# Patient Record
Sex: Female | Born: 1965 | Race: Black or African American | Hispanic: No | Marital: Single | Smoking: Never smoker
Health system: Southern US, Community
[De-identification: ages and names within clinical notes are randomized; demographics above are authoritative.]

## PROBLEM LIST (undated history)

## (undated) DIAGNOSIS — E876 Hypokalemia: Secondary | ICD-10-CM

## (undated) DIAGNOSIS — N83209 Unspecified ovarian cyst, unspecified side: Secondary | ICD-10-CM

## (undated) DIAGNOSIS — I10 Essential (primary) hypertension: Secondary | ICD-10-CM

## (undated) DIAGNOSIS — F32A Depression, unspecified: Secondary | ICD-10-CM

## (undated) DIAGNOSIS — G4730 Sleep apnea, unspecified: Secondary | ICD-10-CM

## (undated) DIAGNOSIS — E7800 Pure hypercholesterolemia, unspecified: Secondary | ICD-10-CM

## (undated) DIAGNOSIS — F329 Major depressive disorder, single episode, unspecified: Secondary | ICD-10-CM

## (undated) DIAGNOSIS — K219 Gastro-esophageal reflux disease without esophagitis: Secondary | ICD-10-CM

## (undated) DIAGNOSIS — E119 Type 2 diabetes mellitus without complications: Secondary | ICD-10-CM

## (undated) HISTORY — DX: Essential (primary) hypertension: I10

## (undated) HISTORY — DX: Pure hypercholesterolemia, unspecified: E78.00

## (undated) HISTORY — DX: Hypokalemia: E87.6

## (undated) HISTORY — DX: Depression, unspecified: F32.A

## (undated) HISTORY — DX: Major depressive disorder, single episode, unspecified: F32.9

## (undated) HISTORY — DX: Gastro-esophageal reflux disease without esophagitis: K21.9

---

## 1997-05-06 ENCOUNTER — Emergency Department (HOSPITAL_COMMUNITY): Admission: EM | Admit: 1997-05-06 | Discharge: 1997-05-06 | Payer: Self-pay | Admitting: Emergency Medicine

## 1998-01-26 ENCOUNTER — Encounter: Admission: RE | Admit: 1998-01-26 | Discharge: 1998-01-26 | Payer: Self-pay | Admitting: Obstetrics

## 1998-03-10 ENCOUNTER — Other Ambulatory Visit: Admission: RE | Admit: 1998-03-10 | Discharge: 1998-03-10 | Payer: Self-pay | Admitting: Obstetrics and Gynecology

## 1999-10-02 ENCOUNTER — Encounter: Admission: RE | Admit: 1999-10-02 | Discharge: 1999-10-02 | Payer: Self-pay | Admitting: Internal Medicine

## 2000-04-22 ENCOUNTER — Encounter: Admission: RE | Admit: 2000-04-22 | Discharge: 2000-04-22 | Payer: Self-pay | Admitting: Internal Medicine

## 2000-12-30 ENCOUNTER — Encounter: Admission: RE | Admit: 2000-12-30 | Discharge: 2000-12-30 | Payer: Self-pay | Admitting: Internal Medicine

## 2001-04-09 ENCOUNTER — Ambulatory Visit (HOSPITAL_BASED_OUTPATIENT_CLINIC_OR_DEPARTMENT_OTHER): Admission: RE | Admit: 2001-04-09 | Discharge: 2001-04-09 | Payer: Self-pay | Admitting: Obstetrics and Gynecology

## 2001-04-14 ENCOUNTER — Emergency Department (HOSPITAL_COMMUNITY): Admission: EM | Admit: 2001-04-14 | Discharge: 2001-04-14 | Payer: Self-pay | Admitting: Emergency Medicine

## 2001-09-12 ENCOUNTER — Emergency Department (HOSPITAL_COMMUNITY): Admission: EM | Admit: 2001-09-12 | Discharge: 2001-09-12 | Payer: Self-pay | Admitting: Emergency Medicine

## 2002-03-17 ENCOUNTER — Emergency Department (HOSPITAL_COMMUNITY): Admission: EM | Admit: 2002-03-17 | Discharge: 2002-03-17 | Payer: Self-pay | Admitting: Emergency Medicine

## 2002-03-17 ENCOUNTER — Encounter: Payer: Self-pay | Admitting: Emergency Medicine

## 2003-01-20 ENCOUNTER — Emergency Department (HOSPITAL_COMMUNITY): Admission: EM | Admit: 2003-01-20 | Discharge: 2003-01-21 | Payer: Self-pay | Admitting: Emergency Medicine

## 2003-09-11 ENCOUNTER — Emergency Department (HOSPITAL_COMMUNITY): Admission: EM | Admit: 2003-09-11 | Discharge: 2003-09-11 | Payer: Self-pay | Admitting: *Deleted

## 2004-06-22 ENCOUNTER — Encounter (HOSPITAL_COMMUNITY): Admission: RE | Admit: 2004-06-22 | Discharge: 2004-09-20 | Payer: Self-pay | Admitting: Family Medicine

## 2004-06-23 ENCOUNTER — Emergency Department (HOSPITAL_COMMUNITY): Admission: EM | Admit: 2004-06-23 | Discharge: 2004-06-24 | Payer: Self-pay | Admitting: Emergency Medicine

## 2004-06-26 ENCOUNTER — Other Ambulatory Visit: Admission: RE | Admit: 2004-06-26 | Discharge: 2004-06-26 | Payer: Self-pay | Admitting: Obstetrics and Gynecology

## 2004-09-30 ENCOUNTER — Emergency Department (HOSPITAL_COMMUNITY): Admission: EM | Admit: 2004-09-30 | Discharge: 2004-10-01 | Payer: Self-pay | Admitting: Emergency Medicine

## 2004-11-30 ENCOUNTER — Encounter (INDEPENDENT_AMBULATORY_CARE_PROVIDER_SITE_OTHER): Payer: Self-pay | Admitting: Specialist

## 2004-11-30 ENCOUNTER — Observation Stay (HOSPITAL_COMMUNITY): Admission: RE | Admit: 2004-11-30 | Discharge: 2004-12-01 | Payer: Self-pay | Admitting: Obstetrics and Gynecology

## 2004-12-03 ENCOUNTER — Emergency Department (HOSPITAL_COMMUNITY): Admission: EM | Admit: 2004-12-03 | Discharge: 2004-12-04 | Payer: Self-pay | Admitting: Emergency Medicine

## 2004-12-17 ENCOUNTER — Ambulatory Visit (HOSPITAL_COMMUNITY): Admission: RE | Admit: 2004-12-17 | Discharge: 2004-12-17 | Payer: Self-pay | Admitting: Obstetrics and Gynecology

## 2005-03-24 ENCOUNTER — Emergency Department (HOSPITAL_COMMUNITY): Admission: EM | Admit: 2005-03-24 | Discharge: 2005-03-24 | Payer: Self-pay | Admitting: Emergency Medicine

## 2005-06-09 ENCOUNTER — Emergency Department (HOSPITAL_COMMUNITY): Admission: EM | Admit: 2005-06-09 | Discharge: 2005-06-09 | Payer: Self-pay | Admitting: *Deleted

## 2007-01-22 HISTORY — PX: PARTIAL HYSTERECTOMY: SHX80

## 2007-01-23 ENCOUNTER — Emergency Department (HOSPITAL_COMMUNITY): Admission: EM | Admit: 2007-01-23 | Discharge: 2007-01-23 | Payer: Self-pay | Admitting: Emergency Medicine

## 2007-11-06 ENCOUNTER — Encounter: Admission: RE | Admit: 2007-11-06 | Discharge: 2007-11-06 | Payer: Self-pay | Admitting: Obstetrics and Gynecology

## 2007-12-02 ENCOUNTER — Ambulatory Visit (HOSPITAL_COMMUNITY): Admission: RE | Admit: 2007-12-02 | Discharge: 2007-12-02 | Payer: Self-pay | Admitting: Obstetrics and Gynecology

## 2007-12-10 ENCOUNTER — Inpatient Hospital Stay (HOSPITAL_COMMUNITY): Admission: RE | Admit: 2007-12-10 | Discharge: 2007-12-12 | Payer: Self-pay | Admitting: Obstetrics and Gynecology

## 2007-12-10 ENCOUNTER — Encounter (INDEPENDENT_AMBULATORY_CARE_PROVIDER_SITE_OTHER): Payer: Self-pay | Admitting: Obstetrics and Gynecology

## 2007-12-13 ENCOUNTER — Inpatient Hospital Stay (HOSPITAL_COMMUNITY): Admission: AD | Admit: 2007-12-13 | Discharge: 2007-12-13 | Payer: Self-pay | Admitting: Obstetrics and Gynecology

## 2008-07-12 ENCOUNTER — Emergency Department (HOSPITAL_COMMUNITY): Admission: EM | Admit: 2008-07-12 | Discharge: 2008-07-13 | Payer: Self-pay | Admitting: Emergency Medicine

## 2008-10-03 ENCOUNTER — Encounter: Admission: RE | Admit: 2008-10-03 | Discharge: 2008-10-03 | Payer: Self-pay | Admitting: Family Medicine

## 2008-10-07 ENCOUNTER — Encounter: Admission: RE | Admit: 2008-10-07 | Discharge: 2008-10-07 | Payer: Self-pay | Admitting: Gastroenterology

## 2008-10-25 ENCOUNTER — Encounter: Admission: RE | Admit: 2008-10-25 | Discharge: 2008-10-25 | Payer: Self-pay | Admitting: Family Medicine

## 2008-11-25 ENCOUNTER — Encounter: Admission: RE | Admit: 2008-11-25 | Discharge: 2009-01-09 | Payer: Self-pay | Admitting: Family Medicine

## 2009-02-27 ENCOUNTER — Emergency Department (HOSPITAL_COMMUNITY): Admission: EM | Admit: 2009-02-27 | Discharge: 2009-02-27 | Payer: Self-pay | Admitting: Emergency Medicine

## 2009-03-30 ENCOUNTER — Emergency Department (HOSPITAL_COMMUNITY): Admission: EM | Admit: 2009-03-30 | Discharge: 2009-03-30 | Payer: Self-pay | Admitting: Emergency Medicine

## 2009-06-06 ENCOUNTER — Emergency Department (HOSPITAL_COMMUNITY): Admission: EM | Admit: 2009-06-06 | Discharge: 2009-06-06 | Payer: Self-pay | Admitting: Emergency Medicine

## 2010-02-12 ENCOUNTER — Encounter: Payer: Self-pay | Admitting: Family Medicine

## 2010-04-09 LAB — URINALYSIS, ROUTINE W REFLEX MICROSCOPIC
Bilirubin Urine: NEGATIVE
Bilirubin Urine: NEGATIVE
Glucose, UA: NEGATIVE mg/dL
Glucose, UA: NEGATIVE mg/dL
Hgb urine dipstick: NEGATIVE
Hgb urine dipstick: NEGATIVE
Ketones, ur: NEGATIVE mg/dL
Ketones, ur: NEGATIVE mg/dL
Nitrite: NEGATIVE
Nitrite: NEGATIVE
Protein, ur: NEGATIVE mg/dL
Protein, ur: NEGATIVE mg/dL
Specific Gravity, Urine: 1.021 (ref 1.005–1.030)
Specific Gravity, Urine: 1.022 (ref 1.005–1.030)
Urobilinogen, UA: 1 mg/dL (ref 0.0–1.0)
Urobilinogen, UA: 1 mg/dL (ref 0.0–1.0)
pH: 6 (ref 5.0–8.0)
pH: 6.5 (ref 5.0–8.0)

## 2010-04-09 LAB — CBC
HCT: 38.9 % (ref 36.0–46.0)
Hemoglobin: 13.5 g/dL (ref 12.0–15.0)
MCHC: 34.8 g/dL (ref 30.0–36.0)
MCV: 87.4 fL (ref 78.0–100.0)
Platelets: 220 10*3/uL (ref 150–400)
RBC: 4.45 MIL/uL (ref 3.87–5.11)
RDW: 13.4 % (ref 11.5–15.5)
WBC: 8.2 10*3/uL (ref 4.0–10.5)

## 2010-04-09 LAB — URINE CULTURE
Colony Count: NO GROWTH
Culture: NO GROWTH

## 2010-04-09 LAB — POCT I-STAT, CHEM 8
BUN: 11 mg/dL (ref 6–23)
Calcium, Ion: 1.16 mmol/L (ref 1.12–1.32)
Chloride: 104 mEq/L (ref 96–112)
Creatinine, Ser: 0.6 mg/dL (ref 0.4–1.2)
Glucose, Bld: 115 mg/dL — ABNORMAL HIGH (ref 70–99)
HCT: 42 % (ref 36.0–46.0)
Hemoglobin: 14.3 g/dL (ref 12.0–15.0)
Potassium: 3.3 mEq/L — ABNORMAL LOW (ref 3.5–5.1)
Sodium: 142 mEq/L (ref 135–145)
TCO2: 29 mmol/L (ref 0–100)

## 2010-04-09 LAB — CK: Total CK: 107 U/L (ref 7–177)

## 2010-04-11 LAB — URINALYSIS, ROUTINE W REFLEX MICROSCOPIC
Bilirubin Urine: NEGATIVE
Glucose, UA: NEGATIVE mg/dL
Hgb urine dipstick: NEGATIVE
Ketones, ur: NEGATIVE mg/dL
Nitrite: NEGATIVE
Protein, ur: NEGATIVE mg/dL
Specific Gravity, Urine: 1.025 (ref 1.005–1.030)
Urobilinogen, UA: 1 mg/dL (ref 0.0–1.0)
pH: 6.5 (ref 5.0–8.0)

## 2010-04-30 LAB — URINALYSIS, ROUTINE W REFLEX MICROSCOPIC
Bilirubin Urine: NEGATIVE
Glucose, UA: NEGATIVE mg/dL
Hgb urine dipstick: NEGATIVE
Ketones, ur: NEGATIVE mg/dL
Nitrite: NEGATIVE
Protein, ur: NEGATIVE mg/dL
Specific Gravity, Urine: 1.022 (ref 1.005–1.030)
Urobilinogen, UA: 0.2 mg/dL (ref 0.0–1.0)
pH: 5.5 (ref 5.0–8.0)

## 2010-06-05 NOTE — Op Note (Signed)
[name], [name]             ACCOUNT NO.:  [id number]   MEDICAL RECORD NO.:  [id number]          PATIENT TYPE:  INP   LOCATION:  9305                          FACILITY:  [facility]   PHYSICIAN:  Osborn Coho, M.D.   DATE OF BIRTH:  July 10, 1965   DATE OF PROCEDURE:  12/10/2007  DATE OF DISCHARGE:  12/12/2007                               OPERATIVE REPORT   PREOPERATIVE DIAGNOSES:  1. Complex left ovarian cyst.  2. Pelvic pain.  3. Status post hysterectomy.   POSTOPERATIVE DIAGNOSES:  1. Complex left ovarian cyst.  2. Pelvic pain.  3. Status post hysterectomy.  4. Dense pelvic adhesions.   PROCEDURES:  1. Laparoscopic lysis of adhesions.  2. Laparotomy.  3. Partial left salpingo-oophorectomy with lysis of adhesions.  4. Cystoscopy.   ATTENDING DOCTOR:  Osborn Coho, MD.   ASSISTANT:  Marquis Lunch. Lowell Guitar, PA-C.   ANESTHESIA:  General.   SPECIMENS TO PATHOLOGY:  Partial left ovarian cyst with portion of ovary  and tube.   FLUIDS:  2300 mL.   URINE OUTPUT:  200 mL.   ESTIMATED BLOOD LOSS:  100 mL.   COMPLICATIONS:  None.   DESCRIPTION OF PROCEDURE:  The patient was taken to the operating room  after risks, benefits, and alternatives were discussed with the patient.  The patient verbalized understanding and consent signed and witnessed.  The patient was placed under general anesthesia and prepped and draped  in the normal sterile fashion in a dorsal lithotomy position.  A ring  forceps with a sponge on it was placed in the patient's vagina and after  gowning and re-gloving, attention was then turned to the abdomen where a  10-mm incision was made at the umbilicus and open laparoscopy performed  by carrying down that incision to the fascia and peritoneum, which was  entered sharply with the Metzenbaum scissors.  The fascia was then  stitched using a pursestring stitch of 0 Vicryl and the Hasson was  placed into the umbilicus and the laparoscope introduced.  Pneumoperitoneum was achieved and there were some adhesions just above  the liver and there were some adhesions about to the sidewalls.  The  patient was placed in Trendelenburg and the intra-adhesions of the bowel  to the right and left ovaries were noted.  Attention was then turned to  the suprapubic region where a 10-mm incision was made and 10-mm trocar  advanced into the intra-abdominal cavity without difficulty under direct  visualization.  In the left lower quadrant, a 5-mm incision was made and  a 5-mm trocar advanced into the intra-abdominal cavity under direct  visualization.  Lysis of adhesions was performed removing the bowel from  the left sidewall and portion of the ovary as well as from the anterior  wall and bladder.  Getting down further with those adhesions of the  bowel to the left ovary, they were noted to be more and more dense and  decision was made to convert to laparotomy.  A Pfannenstiel skin  incision was made at the same level at the 10-mm incision.  This  incision was carried down to the  underlying layer of fascia.  The fascia  was excised bilaterally and extended bilaterally with the Mayo scissors.  Kocher clamps were placed on the superior aspect of the fascial incision  and the rectus muscle was dissected from the fascia.  The same was done  on the inferior aspect of the fascial incision.  The muscle was  separated in the midline and the peritoneum entered at the site of prior  entry with the trocar, which was then extended with the Metzenbaum  scissors and manually.  The pursestring stitch at the umbilicus was  tied.  The O'Connor-O'Sullivan self-retaining retractor was placed and 2  laparotomy sponges were placed to help push away the bowel.  There was  some bleeding noted from accessory vessels of the infundibulopelvic on  the left, which were clipped x3 with hemostatic clips.  Good hemostasis  was noted at that area.  Adhesions were taken down some more  that side  bluntly with the Metzenbaum scissors and secondary to the dense nature  of the adhesions further down the pelvis of the ovary to the sigmoid and  rectum, the decision was made to clamp as much of the ovary and cyst as  possible.  The cyst had already been drained inadvertently and copious  irrigation performed.  The cyst fluid appeared clear serous in nature.  The portion of the left ovary, cyst, and fallopian tube were then  excised and sent to Pathology and the remaining pedicle ligated with 0  Vicryl and suture ligated with 0 Vicryl as well.  There was good  hemostasis.  Copious irrigation was performed.  The small amount of  oozing where some adhesions had been removed near the bowel and the  remaining portion of the ovary and Surgicel was applied to this area.  Good hemostasis was noted.  The laparotomy sponges as well as the  O'Connor-O'Sullivan were removed and the peritoneum was repaired with 2-  0 chromic in a running fashion.  The fascia was repaired with 0 Vicryl  in a running fashion.  The subcutaneous tissue was irrigated and made  hemostatic with the Bovie and reapproximated using 3 interrupted  stitches of 2-0 plain.  The skin was reapproximated using 3-0 Monocryl  via a subcuticular stitch.  The skin was dressed with Benzoin and  quarter-inch Steri-Strips.  The left lower quadrant incision was  repaired with Dermabond.  The umbilical incision was repaired with 3-0  Monocryl via subcuticular stitch.  The sponge stick was removed from the  vagina.  Cystoscopy was performed and bilateral efflux from the ureters  were noted and no inadvertent bladder injuries were noted as well.  Cystoscopy was performed after removing the Foley and prepping the  urethra with Betadine and afterward the Foley was replaced to gravity.  Sponge, lap, and needle count was correct.  The patient tolerated the  procedure well and was returned to recovery room in good condition.       Osborn Coho, M.D.  Electronically Signed     AR/MEDQ  D:  12/12/2007  T:  12/12/2007  Job:  [id number]

## 2010-06-05 NOTE — H&P (Signed)
Barbara, [name]             ACCOUNT NO.:  [id number]   MEDICAL RECORD NO.:  [id number]          PATIENT TYPE:  INP   LOCATION:                                [facility]ACILITY:  [facility]   PHYSICIAN:  Osborn Coho, M.D.   DATE OF BIRTH:  1965-04-09   DATE OF ADMISSION:  12/02/2007  DATE OF DISCHARGE:  12/12/2007                              HISTORY & PHYSICAL   [name] is a 45 year old African American female para 2-0-1-2 who  presents for laparoscopic ovarian cystectomy with a possibility of a  bilateral salpingo-oophorectomy because of chronic pelvic pain and a  complex left ovarian cyst.  For the past year, the patient reports  intermittent pelvic pain, which occurs several times per month lasting  approximately an hour each time.  The patient rates this pain as a 7/10  on a 10-point pain scale.  However, states she does find some relief  (down to 4/10 on a 10-point pain scale) with Tylenol.  The patient is  unable to report any alleviating or exacerbating factors, just reports  that it occurs randomly.  She denies any urinary tract symptoms, nausea,  vomiting, diarrhea, fever, vaginitis symptoms, or changes in her bowel  habits.  A pelvic ultrasound in October 2009 revealed a surgically  absent uterus and cervix with a right ovary measuring 2.77 x 1.59 x 2.17  cm, a left ovary measuring 6.5 x 4.5 x 5.21 cm, and a left ovarian  complex cyst with multiple thin septations at a low level echogenicity  equivalent to 6.0 x 4.3 x 4.5 cm.  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  There was no free fluid seen.  A subsequent abdominal  and pelvic CT scan showed a normal urinary bladder with postoperative  changes consistent with a partial hysterectomy.  The left adnexa  revealed an intermediate density structure measuring 4.79 x 6.2 cm.  There was no ascites.  The bowel loops appeared normal.  There was no  abnormality of the bowel dilatation, wall thickening, or inflammatory  changes.  There were no enlarged lymph nodes identified.  It was  concluded that the patient had an intermediate density mass arising from  the left ovary, which was indeterminate.  It was felt it may represent a  hemorrhagic cyst, endometrioma, a benign or malignant ovarian neoplasm.  The patient underwent a CA-125, which returned normal at 4.3.  Given the  chronicity of the patient's symptoms along with the findings on  ultrasound and CT imaging, the patient has decided to proceed with  removal of this left ovarian mass.   PAST MEDICAL HISTORY:  OB History:  Gravida 3, para 2-0-1-2.  The  patient had two spontaneous vaginal births and one elective termination  in the first trimester.  GYN History:  Menarche at 45 years old.  The patient has had a  hysterectomy.  Denies any history of sexually transmitted diseases or  abnormal Pap smears.  Her last normal Pap smear was in October 2009.  Last normal mammogram was  in November 2009.  Medical History:  Positive for anemia (in the past as low as 5.5),  hypertension, heart murmur, and gastroesophageal reflux disease.   SURGICAL HISTORY:  In 1991 bilateral tubal ligation, in 2003 myomectomy,  in 2006 total laparoscopic hysterectomy.   The patient denies any problems with anesthesia.   FAMILY HISTORY:  Positive for cancer, asthma, hypertension, and  diabetes.   SOCIAL HISTORY:  The patient lives with her two children and two  grandchildren.  Habits:  She denies tobacco, alcohol, or illicit drug  use.   CURRENT MEDICATIONS:  1. Benicar, unknown dosage.  2. Calcium 1000 mg daily.  3. Lasix 40 mg daily.  4. Coreg 40 mg daily.  5. Zetia 10 mg daily.  6. Omeprazole 20 mg daily.  7. Amlodipine 10 mg daily.   ALLERGIES:  She denies any known drug allergies or allergies to latex or  seafood.   REVIEW OF SYSTEMS:  The patient does have a murmur.  She does have   hemorrhoids.  She denies any chest pain, shortness of breath, headache,  vision changes, weight loss, and night sweats except as is mentioned in  history of present illness.  The patient's review of systems is  otherwise negative.   PHYSICAL EXAMINATION:  VITAL SIGNS:  Blood pressure 140/90, repeat blood  pressure 138/90, height 5 feet 2 inches tall, and weight 181 pounds.  EAR, NOSE, AND THROAT:  Pupils are equal.  Hearing normal.  Throat  clear.  NECK:  Without any palpable masses.  HEART:  Regular rate and rhythm.  LUNGS:  Clear.  BACK:  No CVA tenderness.  ABDOMEN:  No tenderness, masses, or organomegaly.  EXTREMITIES:  No clubbing, cyanosis, or edema.  PELVIC:  EG/BUS was within normal limits.  Vagina was normal.  Uterus  and cervix are surgically absent.  Adnexa, no tenderness or masses.   IMPRESSION:  1. Complex left ovarian cyst.  2. Pelvic pain.  3. Status post hysterectomy.   DISPOSITION:  A discussion was held with the patient regarding the  indications for her procedure along with its risks, which include but  are not limited to reaction to anesthesia, damage to adjacent organs,  infection, excessive bleeding, and the possibility that her left ovary  may need to be removed.  The patient verbalized understanding of these  risk and has consented to proceed with laparoscopic left ovarian  cystectomy with the possibility of unilateral or bilateral salpingo-  oophorectomy and the remote possibility of laparotomy.  The patient has  consented to proceed with this procedure at [facility]  [place] on December 10, 2007, at 9:30 a.m.      [name].      Osborn Coho, M.D.  Electronically Signed    EJP/MEDQ  D:  12/07/2007  T:  12/08/2007  Job:  [id number]

## 2010-06-08 NOTE — Discharge Summary (Signed)
Barbara [name], Barbara [name]             ACCOUNT NO.:  [id number]   MEDICAL RECORD NO.:  [id number]          PATIENT TYPE:  INP   LOCATION:  9305                          [facility]ACILITY:  [facility]   PHYSICIAN:  Osborn Coho, M.D.   DATE OF BIRTH:  May 10, 1965   DATE OF ADMISSION:  12/10/2007  DATE OF DISCHARGE:  12/12/2007                               DISCHARGE SUMMARY   DISCHARGE DIAGNOSES:  Complex left ovarian cyst, pelvic pain, dense  pelvic adhesions, and status post hysterectomy.   OPERATION:  On the date of admission, the patient underwent laparoscopic  lysis of adhesions followed by laparotomy, partial left salpingo-  oophorectomy, and lysis of adhesions.  The patient also underwent  cystoscopy, tolerating all of these procedures well.   HISTORY OF PRESENT ILLNESS:  Barbara [name] is a 45 year old African  American female para 2-0-1-2 who presented for a laparoscopic ovarian  cystectomy with a possibility of bilateral salpingo-oophorectomy because  of chronic pelvic pain and a complex left ovarian cyst.  Please see the  patient's dictated history and physical examination for details.   PREOPERATIVE PHYSICAL EXAMINATION:  VITAL SIGNS:  Blood pressure 140/90,  which upon being repeated was 138/90, height 5 feet 2 inches tall,  weight 181 pounds.  GENERAL:  Within normal limits.  PELVIC:  EG/BUS was within normal limits.  Vagina was normal.  Uterus  and cervix were surgically absent.  Adnexa without any tenderness or  masses.   HOSPITAL COURSE:  On the date of admission, the patient underwent  aforementioned procedures tolerating them all well.  The patient's  postoperative course was unremarkable with the patient tolerating a  postop hemoglobin of 11.6 (preop hemoglobin 13.8).  By postop day #2,  the patient had resumed bowel and bladder function and was therefore  deemed ready for discharge home.   DISCHARGE MEDICATIONS:  The patient was advised to follow instructions  on her home  medication reconciliation form.  She was also given,  1. Percocet 5/325 one-two tablets every 4-6 hours as needed for pain.  2. Ibuprofen 600 mg with food every 6 hours for 5 days then as needed      for pain.  3. Colace 100 mg twice daily until bowel movements are regular.   FOLLOWUP:  The patient has a postoperative visit with Dr. Su Hilt on  January 20, 2008, at 1:45 p.m.   DISCHARGE INSTRUCTIONS:  The patient was given a copy of [facility] postoperative instruction sheet.  She was further  advised to avoid driving for 2 weeks, heavy lifting for 4 weeks,  intercourse for 6 weeks, but she may shower, she may bathe, she may walk  up steps.  The patient's diet should be that of a low-sodium diet.  In  terms of wound care, the patient was to keep her incision clean and dry  and that she may use a blow dryer on her cool setting if needed to  assist in her incisional dryness.   FINAL PATHOLOGY:  Peritoneal washings reveal reactive mesothelial cells.  Portion of left ovary, possible fallopian tube:  Hydrosalpinx, small  fragments of  benign ovary.  No endometriosis or evidence of malignancy.      [name].      Osborn Coho, M.D.  Electronically Signed    EJP/MEDQ  D:  01/07/2008  T:  01/08/2008  Job:  [id number]

## 2010-10-10 LAB — URINALYSIS, ROUTINE W REFLEX MICROSCOPIC
Bilirubin Urine: NEGATIVE
Glucose, UA: NEGATIVE
Hgb urine dipstick: NEGATIVE
Ketones, ur: NEGATIVE
Nitrite: NEGATIVE
Protein, ur: NEGATIVE
Specific Gravity, Urine: 1.022
Urobilinogen, UA: 0.2
pH: 6.5

## 2010-10-10 LAB — CBC
HCT: 40.5
Platelets: 267
RDW: 14.2

## 2010-10-10 LAB — COMPREHENSIVE METABOLIC PANEL
Albumin: 4.3
Alkaline Phosphatase: 89
BUN: 8
Potassium: 3.5
Sodium: 137
Total Protein: 8.1

## 2010-10-10 LAB — DIFFERENTIAL
Basophils Relative: 1
Monocytes Absolute: 0.3
Monocytes Relative: 2 — ABNORMAL LOW
Neutro Abs: 7.6

## 2010-10-23 LAB — BASIC METABOLIC PANEL
BUN: 4 — ABNORMAL LOW
BUN: 8
CO2: 28
CO2: 30
Chloride: 104
Chloride: 105
Creatinine, Ser: 0.64
Glucose, Bld: 103 — ABNORMAL HIGH
Potassium: 3.4 — ABNORMAL LOW

## 2010-10-23 LAB — CBC
HCT: 34.2 — ABNORMAL LOW
HCT: 41.4
Hemoglobin: 13.8
MCHC: 34
MCV: 86.9
MCV: 87.1
Platelets: 193
RBC: 4.75
WBC: 7.2

## 2010-11-07 ENCOUNTER — Other Ambulatory Visit: Payer: Self-pay | Admitting: Family Medicine

## 2010-11-07 ENCOUNTER — Other Ambulatory Visit (HOSPITAL_COMMUNITY)
Admission: RE | Admit: 2010-11-07 | Discharge: 2010-11-07 | Disposition: A | Discharge status: Home / Self Care | Payer: Medicaid Other | Source: Ambulatory Visit | Attending: Family Medicine | Admitting: Family Medicine

## 2010-11-07 DIAGNOSIS — Z124 Encounter for screening for malignant neoplasm of cervix: Secondary | ICD-10-CM | POA: Insufficient documentation

## 2011-01-24 ENCOUNTER — Ambulatory Visit: Payer: Medicare Other | Attending: Family Medicine | Admitting: Physical Therapy

## 2011-01-24 DIAGNOSIS — M2550 Pain in unspecified joint: Secondary | ICD-10-CM | POA: Diagnosis not present

## 2011-01-24 DIAGNOSIS — IMO0001 Reserved for inherently not codable concepts without codable children: Secondary | ICD-10-CM | POA: Diagnosis not present

## 2011-01-24 DIAGNOSIS — M2560 Stiffness of unspecified joint, not elsewhere classified: Secondary | ICD-10-CM | POA: Insufficient documentation

## 2011-01-25 DIAGNOSIS — K219 Gastro-esophageal reflux disease without esophagitis: Secondary | ICD-10-CM | POA: Diagnosis not present

## 2011-01-25 DIAGNOSIS — K649 Unspecified hemorrhoids: Secondary | ICD-10-CM | POA: Diagnosis not present

## 2011-01-28 ENCOUNTER — Ambulatory Visit: Payer: Medicare Other | Admitting: Physical Therapy

## 2011-01-28 DIAGNOSIS — M2560 Stiffness of unspecified joint, not elsewhere classified: Secondary | ICD-10-CM | POA: Diagnosis not present

## 2011-01-28 DIAGNOSIS — IMO0001 Reserved for inherently not codable concepts without codable children: Secondary | ICD-10-CM | POA: Diagnosis not present

## 2011-01-28 DIAGNOSIS — M2550 Pain in unspecified joint: Secondary | ICD-10-CM | POA: Diagnosis not present

## 2011-01-30 ENCOUNTER — Ambulatory Visit: Payer: Medicare Other | Admitting: Physical Therapy

## 2011-01-30 DIAGNOSIS — M2560 Stiffness of unspecified joint, not elsewhere classified: Secondary | ICD-10-CM | POA: Diagnosis not present

## 2011-01-30 DIAGNOSIS — M2550 Pain in unspecified joint: Secondary | ICD-10-CM | POA: Diagnosis not present

## 2011-01-30 DIAGNOSIS — IMO0001 Reserved for inherently not codable concepts without codable children: Secondary | ICD-10-CM | POA: Diagnosis not present

## 2011-02-05 ENCOUNTER — Encounter: Payer: Self-pay | Admitting: Physical Therapy

## 2011-02-07 ENCOUNTER — Ambulatory Visit: Payer: Medicare Other | Admitting: Physical Therapy

## 2011-02-07 DIAGNOSIS — M2560 Stiffness of unspecified joint, not elsewhere classified: Secondary | ICD-10-CM | POA: Diagnosis not present

## 2011-02-07 DIAGNOSIS — IMO0001 Reserved for inherently not codable concepts without codable children: Secondary | ICD-10-CM | POA: Diagnosis not present

## 2011-02-07 DIAGNOSIS — M2550 Pain in unspecified joint: Secondary | ICD-10-CM | POA: Diagnosis not present

## 2011-02-13 ENCOUNTER — Encounter: Payer: Medicaid Other | Admitting: Physical Therapy

## 2011-02-18 DIAGNOSIS — E876 Hypokalemia: Secondary | ICD-10-CM | POA: Diagnosis not present

## 2011-02-19 ENCOUNTER — Ambulatory Visit: Payer: Medicare Other | Admitting: Physical Therapy

## 2011-02-19 DIAGNOSIS — IMO0001 Reserved for inherently not codable concepts without codable children: Secondary | ICD-10-CM | POA: Diagnosis not present

## 2011-02-19 DIAGNOSIS — M2550 Pain in unspecified joint: Secondary | ICD-10-CM | POA: Diagnosis not present

## 2011-02-19 DIAGNOSIS — M2560 Stiffness of unspecified joint, not elsewhere classified: Secondary | ICD-10-CM | POA: Diagnosis not present

## 2011-02-21 ENCOUNTER — Ambulatory Visit: Payer: Medicare Other | Admitting: Physical Therapy

## 2011-02-21 DIAGNOSIS — IMO0001 Reserved for inherently not codable concepts without codable children: Secondary | ICD-10-CM | POA: Diagnosis not present

## 2011-02-21 DIAGNOSIS — M2560 Stiffness of unspecified joint, not elsewhere classified: Secondary | ICD-10-CM | POA: Diagnosis not present

## 2011-02-21 DIAGNOSIS — M2550 Pain in unspecified joint: Secondary | ICD-10-CM | POA: Diagnosis not present

## 2011-02-26 ENCOUNTER — Ambulatory Visit: Payer: Medicare Other | Attending: Family Medicine | Admitting: Physical Therapy

## 2011-02-26 DIAGNOSIS — M2550 Pain in unspecified joint: Secondary | ICD-10-CM | POA: Diagnosis not present

## 2011-02-26 DIAGNOSIS — IMO0001 Reserved for inherently not codable concepts without codable children: Secondary | ICD-10-CM | POA: Insufficient documentation

## 2011-02-26 DIAGNOSIS — M2560 Stiffness of unspecified joint, not elsewhere classified: Secondary | ICD-10-CM | POA: Insufficient documentation

## 2011-02-28 ENCOUNTER — Ambulatory Visit: Payer: Medicaid Other | Admitting: Physical Therapy

## 2011-02-28 ENCOUNTER — Encounter: Payer: Medicaid Other | Admitting: Physical Therapy

## 2011-03-06 DIAGNOSIS — L259 Unspecified contact dermatitis, unspecified cause: Secondary | ICD-10-CM | POA: Diagnosis not present

## 2011-04-02 DIAGNOSIS — B372 Candidiasis of skin and nail: Secondary | ICD-10-CM | POA: Diagnosis not present

## 2011-04-02 DIAGNOSIS — H60399 Other infective otitis externa, unspecified ear: Secondary | ICD-10-CM | POA: Diagnosis not present

## 2011-05-27 DIAGNOSIS — M25559 Pain in unspecified hip: Secondary | ICD-10-CM | POA: Diagnosis not present

## 2011-08-09 DIAGNOSIS — M545 Low back pain: Secondary | ICD-10-CM | POA: Diagnosis not present

## 2011-08-09 DIAGNOSIS — I10 Essential (primary) hypertension: Secondary | ICD-10-CM | POA: Diagnosis not present

## 2011-08-09 DIAGNOSIS — B372 Candidiasis of skin and nail: Secondary | ICD-10-CM | POA: Diagnosis not present

## 2011-08-09 DIAGNOSIS — L509 Urticaria, unspecified: Secondary | ICD-10-CM | POA: Diagnosis not present

## 2011-09-04 DIAGNOSIS — M25559 Pain in unspecified hip: Secondary | ICD-10-CM | POA: Diagnosis not present

## 2011-09-30 DIAGNOSIS — M25559 Pain in unspecified hip: Secondary | ICD-10-CM | POA: Diagnosis not present

## 2011-10-10 DIAGNOSIS — M1610 Unilateral primary osteoarthritis, unspecified hip: Secondary | ICD-10-CM | POA: Diagnosis not present

## 2011-10-23 ENCOUNTER — Encounter: Payer: Medicare Other | Admitting: Obstetrics and Gynecology

## 2011-10-30 ENCOUNTER — Ambulatory Visit (INDEPENDENT_AMBULATORY_CARE_PROVIDER_SITE_OTHER): Payer: Medicare Other | Admitting: Obstetrics and Gynecology

## 2011-10-30 ENCOUNTER — Encounter: Payer: Self-pay | Admitting: Obstetrics and Gynecology

## 2011-10-30 VITALS — BP 142/90 | Temp 98.8°F | Wt 177.0 lb

## 2011-10-30 DIAGNOSIS — R102 Pelvic and perineal pain: Secondary | ICD-10-CM

## 2011-10-30 DIAGNOSIS — E876 Hypokalemia: Secondary | ICD-10-CM | POA: Insufficient documentation

## 2011-10-30 DIAGNOSIS — I10 Essential (primary) hypertension: Secondary | ICD-10-CM | POA: Insufficient documentation

## 2011-10-30 DIAGNOSIS — Z113 Encounter for screening for infections with a predominantly sexual mode of transmission: Secondary | ICD-10-CM

## 2011-10-30 DIAGNOSIS — Z124 Encounter for screening for malignant neoplasm of cervix: Secondary | ICD-10-CM

## 2011-10-30 DIAGNOSIS — E7800 Pure hypercholesterolemia, unspecified: Secondary | ICD-10-CM | POA: Insufficient documentation

## 2011-10-30 DIAGNOSIS — K219 Gastro-esophageal reflux disease without esophagitis: Secondary | ICD-10-CM | POA: Insufficient documentation

## 2011-10-30 DIAGNOSIS — Z01419 Encounter for gynecological examination (general) (routine) without abnormal findings: Secondary | ICD-10-CM

## 2011-10-30 DIAGNOSIS — F329 Major depressive disorder, single episode, unspecified: Secondary | ICD-10-CM | POA: Insufficient documentation

## 2011-10-30 LAB — POCT URINALYSIS DIPSTICK
Blood, UA: NEGATIVE
Ketones, UA: NEGATIVE
Leukocytes, UA: NEGATIVE
Nitrite, UA: NEGATIVE
Protein, UA: NEGATIVE
pH, UA: 5

## 2011-10-30 NOTE — Progress Notes (Signed)
Subjective:    Barbara [name] is a 46 y.o. female, [id number], who presents for an annual exam. The patient reports pelvic pain that radiates to her back x 1 year (off and on). Symptoms are worse with certain positions while laying down, going up steps and with lifting.  Also admits to some discomfort with intercourse.  Denies urinary tract  or bowel symptoms.  Has had a work up for this with no findings to include an MRI of her back.  Menstrual cycle:   LMP: No LMP recorded. Patient has had a hysterectomy.  Hysterectomy with one ovary removed             Review of Systems Pertinent items are noted in HPI. Denies pelvic pain, urinary tract symptoms, vaginitis symptoms, irregular bleeding, menopausal symptoms, change in bowel habits or rectal bleeding   Objective:    BP 142/90  Temp 98.8 F (37.1 C) (Oral)  Wt 177 lb (80.287 kg)    Wt Readings from Last 1 Encounters:  10/30/11 177 lb (80.287 kg)   There is no height on file to calculate BMI. General Appearance: Alert, no acute distress HEENT: Grossly normal Neck / Thyroid: Supple, no thyromegaly or cervical adenopathy Lungs: Clear to auscultation bilaterally Back: No CVA tenderness Breast Exam: No masses or nodes.No dimpling, nipple retraction or discharge. Cardiovascular: Regular rate and rhythm.  Gastrointestinal: Soft, non-tender, no masses or organomegaly Pelvic Exam: EGBUS-wnl, vagina-normal rugae, cervix/uterus surgically absent,  adnexae-no masses or tenderness Rectovaginal: no masses and normal sphincter tone Lymphatic Exam: Non-palpable nodes in neck, clavicular,  axillary, or inguinal regions  Skin: no rashes or abnormalities Extremities: no clubbing cyanosis or edema  Neurologic: grossly normal Psychiatric: Alert and oriented   Assessment:   Routine GYN Exam S/P Hysterectomy Pelvic/Back Pain Plan:  Reviewed causes of pelvic pain: urogenital, previous surgery, gastrointestinal and musculoskeletal.  Pelvic U/S to  rule out pelvic masses    RTO for U/S  Zyah Gomm,ELMIRAPA-C

## 2011-10-30 NOTE — Patient Instructions (Signed)
Causes of pelvic pain: your bladder,  previous surgery on your abdomen/stomach,  your stomach and digestion and bones/muscles/nerves in the pelvic area

## 2011-10-30 NOTE — Progress Notes (Signed)
The patient reports:she complains of abdominal pain x 1 month.  Pt states low pelvic pain that radiates around to the back.   Contraception:hysterectomy  Last mammogram: was normal December 2012 Last pap: was normal in 2013 with Dr Burnadette Pop cultures offered: requested HIV/RPR/HbsAg offered:  requested HSV 1 and 2 glycoprotein offered: requested  Menstrual cycle regular and monthly: No: hyst Menstrual flow normal: No: hyst  Urinary symptoms: flank pain on bilateral Normal bowel movements: Yes Reports abuse at home: No:

## 2011-11-04 DIAGNOSIS — F333 Major depressive disorder, recurrent, severe with psychotic symptoms: Secondary | ICD-10-CM | POA: Diagnosis not present

## 2011-11-08 DIAGNOSIS — I10 Essential (primary) hypertension: Secondary | ICD-10-CM | POA: Diagnosis not present

## 2011-11-08 DIAGNOSIS — E7800 Pure hypercholesterolemia, unspecified: Secondary | ICD-10-CM | POA: Diagnosis not present

## 2011-11-08 DIAGNOSIS — K219 Gastro-esophageal reflux disease without esophagitis: Secondary | ICD-10-CM | POA: Diagnosis not present

## 2011-11-11 ENCOUNTER — Encounter: Payer: Self-pay | Admitting: Obstetrics and Gynecology

## 2011-11-11 ENCOUNTER — Ambulatory Visit (INDEPENDENT_AMBULATORY_CARE_PROVIDER_SITE_OTHER): Payer: Medicare Other | Admitting: Obstetrics and Gynecology

## 2011-11-11 ENCOUNTER — Ambulatory Visit (INDEPENDENT_AMBULATORY_CARE_PROVIDER_SITE_OTHER): Payer: Medicare Other

## 2011-11-11 VITALS — BP 118/80 | Temp 98.7°F | Wt 175.0 lb

## 2011-11-11 DIAGNOSIS — R102 Pelvic and perineal pain: Secondary | ICD-10-CM

## 2011-11-11 DIAGNOSIS — N949 Unspecified condition associated with female genital organs and menstrual cycle: Secondary | ICD-10-CM

## 2011-11-11 NOTE — Progress Notes (Signed)
46 YO S/P Hysterectomy/RSO seen 10/30/11 for intermittent pelvic pain for 1 year accompanied by lower back discomfort, presents for an ultrasound.  Patient has had back MRI and evaluation, denies uti or bowel symptoms.  Knows of no definite worsening of symptoms when they are present except certain movements will make it worse but otherwise no initiating factors and mild pain relief with Tylenol.   O: U/S: uterus/right ovary-surgically absent; left ovarian cyst-simple- 2.67 x 1.83 x 2.32 cm   A: Pelvic Pain     Simple Left Ovarian Cyst   P: Reviewed causes of pelvic pain:  GI, GYN, GU,  surgery, and musculoskeletal      Reviewed ovarian cysts and management.  Repeat U/S in 6 weeks with M.D. follow up      Pelvic U/S in 6 weeks     Barbara Talaga, PA-C

## 2011-11-29 DIAGNOSIS — J309 Allergic rhinitis, unspecified: Secondary | ICD-10-CM | POA: Diagnosis not present

## 2011-11-29 DIAGNOSIS — I10 Essential (primary) hypertension: Secondary | ICD-10-CM | POA: Diagnosis not present

## 2011-11-29 DIAGNOSIS — H6690 Otitis media, unspecified, unspecified ear: Secondary | ICD-10-CM | POA: Diagnosis not present

## 2011-12-02 DIAGNOSIS — F333 Major depressive disorder, recurrent, severe with psychotic symptoms: Secondary | ICD-10-CM | POA: Diagnosis not present

## 2011-12-17 DIAGNOSIS — G4733 Obstructive sleep apnea (adult) (pediatric): Secondary | ICD-10-CM | POA: Diagnosis not present

## 2011-12-25 ENCOUNTER — Other Ambulatory Visit: Payer: Medicare Other

## 2011-12-25 ENCOUNTER — Encounter: Payer: Medicare Other | Admitting: Obstetrics and Gynecology

## 2011-12-31 DIAGNOSIS — F333 Major depressive disorder, recurrent, severe with psychotic symptoms: Secondary | ICD-10-CM | POA: Diagnosis not present

## 2012-01-06 ENCOUNTER — Ambulatory Visit (INDEPENDENT_AMBULATORY_CARE_PROVIDER_SITE_OTHER): Payer: Medicare Other

## 2012-01-06 ENCOUNTER — Ambulatory Visit (INDEPENDENT_AMBULATORY_CARE_PROVIDER_SITE_OTHER): Payer: Medicare Other | Admitting: Obstetrics and Gynecology

## 2012-01-06 ENCOUNTER — Encounter: Payer: Self-pay | Admitting: Obstetrics and Gynecology

## 2012-01-06 VITALS — BP 124/72 | Ht 62.0 in | Wt 173.0 lb

## 2012-01-06 DIAGNOSIS — R102 Pelvic and perineal pain: Secondary | ICD-10-CM

## 2012-01-06 DIAGNOSIS — N949 Unspecified condition associated with female genital organs and menstrual cycle: Secondary | ICD-10-CM | POA: Diagnosis not present

## 2012-01-06 DIAGNOSIS — N83209 Unspecified ovarian cyst, unspecified side: Secondary | ICD-10-CM

## 2012-01-06 DIAGNOSIS — N83202 Unspecified ovarian cyst, left side: Secondary | ICD-10-CM

## 2012-01-06 NOTE — Progress Notes (Signed)
Here to f/u u/s for left ovarian cyst.  No complaints.  U/s - ut surgically absent and nl left ovary, left cyst has resolved, rt oophorectomy, no fluid in cul de sac and nl adnexa   Filed Vitals:   01/06/12 1519  BP: 124/72    A/P October 2014 for AEX

## 2012-01-08 DIAGNOSIS — L509 Urticaria, unspecified: Secondary | ICD-10-CM | POA: Diagnosis not present

## 2012-01-16 DIAGNOSIS — Z1231 Encounter for screening mammogram for malignant neoplasm of breast: Secondary | ICD-10-CM | POA: Diagnosis not present

## 2012-02-07 DIAGNOSIS — L509 Urticaria, unspecified: Secondary | ICD-10-CM | POA: Diagnosis not present

## 2012-02-07 DIAGNOSIS — I10 Essential (primary) hypertension: Secondary | ICD-10-CM | POA: Diagnosis not present

## 2012-03-02 DIAGNOSIS — R109 Unspecified abdominal pain: Secondary | ICD-10-CM | POA: Diagnosis not present

## 2012-03-02 DIAGNOSIS — I10 Essential (primary) hypertension: Secondary | ICD-10-CM | POA: Diagnosis not present

## 2012-03-02 DIAGNOSIS — A088 Other specified intestinal infections: Secondary | ICD-10-CM | POA: Diagnosis not present

## 2012-03-03 DIAGNOSIS — F333 Major depressive disorder, recurrent, severe with psychotic symptoms: Secondary | ICD-10-CM | POA: Diagnosis not present

## 2012-03-09 DIAGNOSIS — I10 Essential (primary) hypertension: Secondary | ICD-10-CM | POA: Diagnosis not present

## 2012-03-11 DIAGNOSIS — H6120 Impacted cerumen, unspecified ear: Secondary | ICD-10-CM | POA: Diagnosis not present

## 2012-03-11 DIAGNOSIS — L509 Urticaria, unspecified: Secondary | ICD-10-CM | POA: Diagnosis not present

## 2012-03-11 DIAGNOSIS — E119 Type 2 diabetes mellitus without complications: Secondary | ICD-10-CM | POA: Diagnosis not present

## 2012-04-01 DIAGNOSIS — J310 Chronic rhinitis: Secondary | ICD-10-CM | POA: Diagnosis not present

## 2012-04-01 DIAGNOSIS — L509 Urticaria, unspecified: Secondary | ICD-10-CM | POA: Diagnosis not present

## 2012-04-14 DIAGNOSIS — F333 Major depressive disorder, recurrent, severe with psychotic symptoms: Secondary | ICD-10-CM | POA: Diagnosis not present

## 2012-04-15 DIAGNOSIS — J309 Allergic rhinitis, unspecified: Secondary | ICD-10-CM | POA: Diagnosis not present

## 2012-04-15 DIAGNOSIS — L509 Urticaria, unspecified: Secondary | ICD-10-CM | POA: Diagnosis not present

## 2012-05-27 DIAGNOSIS — N760 Acute vaginitis: Secondary | ICD-10-CM | POA: Diagnosis not present

## 2012-05-27 DIAGNOSIS — N898 Other specified noninflammatory disorders of vagina: Secondary | ICD-10-CM | POA: Diagnosis not present

## 2012-05-27 DIAGNOSIS — R109 Unspecified abdominal pain: Secondary | ICD-10-CM | POA: Diagnosis not present

## 2012-05-29 DIAGNOSIS — H6120 Impacted cerumen, unspecified ear: Secondary | ICD-10-CM | POA: Diagnosis not present

## 2012-06-09 DIAGNOSIS — F333 Major depressive disorder, recurrent, severe with psychotic symptoms: Secondary | ICD-10-CM | POA: Diagnosis not present

## 2012-06-11 DIAGNOSIS — N83209 Unspecified ovarian cyst, unspecified side: Secondary | ICD-10-CM | POA: Diagnosis not present

## 2012-06-11 DIAGNOSIS — N949 Unspecified condition associated with female genital organs and menstrual cycle: Secondary | ICD-10-CM | POA: Diagnosis not present

## 2012-07-22 DIAGNOSIS — R079 Chest pain, unspecified: Secondary | ICD-10-CM | POA: Diagnosis not present

## 2012-07-22 DIAGNOSIS — M25569 Pain in unspecified knee: Secondary | ICD-10-CM | POA: Diagnosis not present

## 2012-07-22 DIAGNOSIS — I10 Essential (primary) hypertension: Secondary | ICD-10-CM | POA: Diagnosis not present

## 2012-08-12 DIAGNOSIS — I10 Essential (primary) hypertension: Secondary | ICD-10-CM | POA: Diagnosis not present

## 2012-08-26 DIAGNOSIS — N9989 Other postprocedural complications and disorders of genitourinary system: Secondary | ICD-10-CM | POA: Diagnosis not present

## 2012-08-26 DIAGNOSIS — N83209 Unspecified ovarian cyst, unspecified side: Secondary | ICD-10-CM | POA: Diagnosis not present

## 2012-08-26 DIAGNOSIS — IMO0002 Reserved for concepts with insufficient information to code with codable children: Secondary | ICD-10-CM | POA: Diagnosis not present

## 2012-09-14 DIAGNOSIS — I10 Essential (primary) hypertension: Secondary | ICD-10-CM | POA: Diagnosis not present

## 2012-09-26 ENCOUNTER — Encounter (HOSPITAL_COMMUNITY): Payer: Self-pay | Admitting: Emergency Medicine

## 2012-09-26 ENCOUNTER — Emergency Department (HOSPITAL_COMMUNITY)
Admission: EM | Admit: 2012-09-26 | Discharge: 2012-09-26 | Disposition: A | Discharge status: Home / Self Care | Payer: Medicare Other | Attending: Emergency Medicine | Admitting: Emergency Medicine

## 2012-09-26 ENCOUNTER — Emergency Department (HOSPITAL_COMMUNITY): Payer: Medicare Other

## 2012-09-26 DIAGNOSIS — Y9289 Other specified places as the place of occurrence of the external cause: Secondary | ICD-10-CM | POA: Insufficient documentation

## 2012-09-26 DIAGNOSIS — S0003XA Contusion of scalp, initial encounter: Secondary | ICD-10-CM | POA: Insufficient documentation

## 2012-09-26 DIAGNOSIS — Z8659 Personal history of other mental and behavioral disorders: Secondary | ICD-10-CM | POA: Insufficient documentation

## 2012-09-26 DIAGNOSIS — W1809XA Striking against other object with subsequent fall, initial encounter: Secondary | ICD-10-CM | POA: Insufficient documentation

## 2012-09-26 DIAGNOSIS — Z79899 Other long term (current) drug therapy: Secondary | ICD-10-CM | POA: Diagnosis not present

## 2012-09-26 DIAGNOSIS — Y9389 Activity, other specified: Secondary | ICD-10-CM | POA: Insufficient documentation

## 2012-09-26 DIAGNOSIS — K219 Gastro-esophageal reflux disease without esophagitis: Secondary | ICD-10-CM | POA: Diagnosis not present

## 2012-09-26 DIAGNOSIS — I10 Essential (primary) hypertension: Secondary | ICD-10-CM | POA: Diagnosis not present

## 2012-09-26 DIAGNOSIS — E7800 Pure hypercholesterolemia, unspecified: Secondary | ICD-10-CM | POA: Diagnosis not present

## 2012-09-26 DIAGNOSIS — S060X9A Concussion with loss of consciousness of unspecified duration, initial encounter: Secondary | ICD-10-CM | POA: Diagnosis not present

## 2012-09-26 DIAGNOSIS — R110 Nausea: Secondary | ICD-10-CM | POA: Diagnosis not present

## 2012-09-26 DIAGNOSIS — S0093XA Contusion of unspecified part of head, initial encounter: Secondary | ICD-10-CM

## 2012-09-26 MED ORDER — ONDANSETRON HCL 4 MG PO TABS: 4.0000 mg | ORAL_TABLET | Freq: Four times a day (QID) | ORAL | Status: DC

## 2012-09-26 MED ORDER — ONDANSETRON HCL 4 MG PO TABS
4.0000 mg | ORAL_TABLET | Freq: Once | ORAL | Status: AC
  Administered 2012-09-26: 4 mg via ORAL
  Filled 2012-09-26: qty 1

## 2012-09-26 MED ORDER — HYDROCODONE-ACETAMINOPHEN 5-325 MG PO TABS: 1.0000 | ORAL_TABLET | ORAL | Status: DC | PRN

## 2012-09-26 NOTE — ED Provider Notes (Signed)
CSN: [id number]     Arrival date & time 09/26/12  2105 History   First MD Initiated Contact with Patient 09/26/12 2201     Chief Complaint  Patient presents with  . Fall  . Headache   (Consider location/radiation/quality/duration/timing/severity/associated sxs/prior Treatment) Patient is a 47 y.o. female presenting with fall. The history is provided by the patient. No language interpreter was used.  Fall This is a new problem. The current episode started today. Associated symptoms include headaches and nausea. Pertinent negatives include no chest pain, neck pain or weakness. Associated symptoms comments: She fell while getting out of her car earlier, hitting her head on the pavement. No LOC. She has experienced nausea since the fall. She states that she has had a "nagging" headache for several days but that it has been significant pain since the fall. . Nothing aggravates the symptoms. She has tried nothing for the symptoms.    Past Medical History  Diagnosis Date  . Hypertension   . Hypercholesteremia   . Depression   . Low blood potassium   . GERD (gastroesophageal reflux disease)    Past Surgical History  Procedure Laterality Date  . Partial hysterectomy  2009   Family History  Problem Relation Age of Onset  . Cancer Paternal Grandmother 74    bone  . Diabetes Paternal Grandmother   . Diabetes Father    History  Substance Use Topics  . Smoking status: Never Smoker   . Smokeless tobacco: Never Used  . Alcohol Use: No   OB History   Grav Para Term Preterm Abortions TAB SAB Ect Mult Living   3 2   1     2      Review of Systems  HENT: Negative for neck pain.   Eyes: Negative for photophobia.  Cardiovascular: Negative for chest pain.  Gastrointestinal: Positive for nausea.  Neurological: Positive for headaches. Negative for syncope and weakness.    Allergies  Review of patient's allergies indicates no known allergies.  Home Medications   Current Outpatient Rx   Name  Route  Sig  Dispense  Refill  . amLODipine (NORVASC) 10 MG tablet   Oral   Take 10 mg by mouth every morning.         Marland Kitchen atorvastatin (LIPITOR) 20 MG tablet   Oral   Take 20 mg by mouth every morning.          . carvedilol (COREG) 6.25 MG tablet   Oral   Take 6.25 mg by mouth 2 (two) times daily with a meal.         . cetirizine (ZYRTEC) 10 MG tablet   Oral   Take 10 mg by mouth every morning.         . furosemide (LASIX) 40 MG tablet   Oral   Take 40 mg by mouth every morning.          . Melatonin 5 MG TABS   Oral   Take 1 tablet by mouth at bedtime as needed (insomnia).         Marland Kitchen omeprazole (PRILOSEC) 20 MG capsule   Oral   Take 20 mg by mouth every morning.         . potassium chloride (K-DUR) 10 MEQ tablet   Oral   Take 10 mEq by mouth 2 (two) times daily.          . traMADol (ULTRAM) 50 MG tablet   Oral   Take 50-100 mg by mouth 3 (  three) times daily as needed for pain.         . valsartan (DIOVAN) 80 MG tablet   Oral   Take 80 mg by mouth every morning.          BP 151/96  Pulse 78  Temp(Src) 98.1 F (36.7 C) (Oral)  Resp 18  Ht 5\' 2"  (1.575 m)  Wt 180 lb (81.647 kg)  BMI 32.91 kg/m2  SpO2 96% Physical Exam  Constitutional: She is oriented to person, place, and time. She appears well-developed and well-nourished.  HENT:  Head: Normocephalic.  Eyes: Pupils are equal, round, and reactive to light.  Neck: Normal range of motion. Neck supple.  Cardiovascular: Normal rate and regular rhythm.   Pulmonary/Chest: Effort normal and breath sounds normal.  Abdominal: Soft. Bowel sounds are normal. There is no tenderness. There is no rebound and no guarding.  Musculoskeletal: Normal range of motion.  Neurological: She is alert and oriented to person, place, and time. She has normal strength and normal reflexes. No sensory deficit. She displays a negative Romberg sign. Coordination normal.  Skin: Skin is warm and dry. No rash noted.   Psychiatric: She has a normal mood and affect.    ED Course  Procedures (including critical care time) Labs Review Labs Reviewed - No data to display Imaging Review No results found.  MDM  No diagnosis found. 1. Contusion head  She had a fall with persistent headache, no LOC or neurologic exam findings. Negative head CT. Doubt IC head injury. Stable for discharge.     Arnoldo Hooker, PA-C 09/26/12 2319

## 2012-09-26 NOTE — ED Notes (Signed)
Pt c/o headache after fall hitting back of head on ground. Pt states she fell to ground after getting out of car today ?1800. Pt states she is unsure why she fell. Pt states she then drove herself home, bathed and drove herself to ED. A & O. C/o nausea.

## 2012-09-26 NOTE — ED Provider Notes (Signed)
Medical screening examination/treatment/procedure(s) were performed by non-physician practitioner and as supervising physician I was immediately available for consultation/collaboration.  Lyanne Co, MD 09/26/12 [phone]

## 2012-09-26 NOTE — ED Notes (Signed)
Pt states she had a fall today and hit her head on the pavement.  She admits to LOC Having HA x 2 days. She does not recall the time she hit her head, speculates it was around 1900 today. Her was the scene with her when she fell. States since hitting head she feels nauseated, blurred vision,     Has H/o HTN F/H, father with aneurysm

## 2012-10-07 ENCOUNTER — Other Ambulatory Visit: Payer: Self-pay | Admitting: Obstetrics and Gynecology

## 2012-10-16 ENCOUNTER — Encounter (HOSPITAL_COMMUNITY): Payer: Self-pay | Admitting: Pharmacist

## 2012-10-17 ENCOUNTER — Encounter (HOSPITAL_COMMUNITY): Payer: Self-pay

## 2012-10-17 ENCOUNTER — Emergency Department (HOSPITAL_COMMUNITY)
Admission: EM | Admit: 2012-10-17 | Discharge: 2012-10-17 | Disposition: A | Discharge status: Home / Self Care | Payer: Medicare Other | Attending: Emergency Medicine | Admitting: Emergency Medicine

## 2012-10-17 ENCOUNTER — Emergency Department (HOSPITAL_COMMUNITY): Payer: Medicare Other

## 2012-10-17 DIAGNOSIS — R110 Nausea: Secondary | ICD-10-CM | POA: Insufficient documentation

## 2012-10-17 DIAGNOSIS — I10 Essential (primary) hypertension: Secondary | ICD-10-CM | POA: Insufficient documentation

## 2012-10-17 DIAGNOSIS — N7013 Chronic salpingitis and oophoritis: Secondary | ICD-10-CM | POA: Diagnosis not present

## 2012-10-17 DIAGNOSIS — F329 Major depressive disorder, single episode, unspecified: Secondary | ICD-10-CM | POA: Diagnosis not present

## 2012-10-17 DIAGNOSIS — R630 Anorexia: Secondary | ICD-10-CM | POA: Insufficient documentation

## 2012-10-17 DIAGNOSIS — Z90710 Acquired absence of both cervix and uterus: Secondary | ICD-10-CM | POA: Diagnosis not present

## 2012-10-17 DIAGNOSIS — N760 Acute vaginitis: Secondary | ICD-10-CM | POA: Insufficient documentation

## 2012-10-17 DIAGNOSIS — E7800 Pure hypercholesterolemia, unspecified: Secondary | ICD-10-CM | POA: Diagnosis not present

## 2012-10-17 DIAGNOSIS — R109 Unspecified abdominal pain: Secondary | ICD-10-CM | POA: Insufficient documentation

## 2012-10-17 DIAGNOSIS — Z79899 Other long term (current) drug therapy: Secondary | ICD-10-CM | POA: Diagnosis not present

## 2012-10-17 DIAGNOSIS — A499 Bacterial infection, unspecified: Secondary | ICD-10-CM | POA: Diagnosis not present

## 2012-10-17 DIAGNOSIS — N83209 Unspecified ovarian cyst, unspecified side: Secondary | ICD-10-CM | POA: Diagnosis not present

## 2012-10-17 DIAGNOSIS — F3289 Other specified depressive episodes: Secondary | ICD-10-CM | POA: Insufficient documentation

## 2012-10-17 DIAGNOSIS — N949 Unspecified condition associated with female genital organs and menstrual cycle: Secondary | ICD-10-CM | POA: Diagnosis not present

## 2012-10-17 DIAGNOSIS — IMO0002 Reserved for concepts with insufficient information to code with codable children: Secondary | ICD-10-CM | POA: Diagnosis not present

## 2012-10-17 DIAGNOSIS — N7011 Chronic salpingitis: Secondary | ICD-10-CM

## 2012-10-17 DIAGNOSIS — K219 Gastro-esophageal reflux disease without esophagitis: Secondary | ICD-10-CM | POA: Diagnosis not present

## 2012-10-17 DIAGNOSIS — B9689 Other specified bacterial agents as the cause of diseases classified elsewhere: Secondary | ICD-10-CM

## 2012-10-17 HISTORY — DX: Unspecified ovarian cyst, unspecified side: N83.209

## 2012-10-17 LAB — WET PREP, GENITAL

## 2012-10-17 LAB — URINALYSIS, ROUTINE W REFLEX MICROSCOPIC
Nitrite: NEGATIVE
Protein, ur: NEGATIVE mg/dL
Specific Gravity, Urine: 1.013 (ref 1.005–1.030)
Urobilinogen, UA: 0.2 mg/dL (ref 0.0–1.0)

## 2012-10-17 MED ORDER — METRONIDAZOLE 500 MG PO TABS: 500.0000 mg | ORAL_TABLET | Freq: Two times a day (BID) | ORAL | Status: DC

## 2012-10-17 MED ORDER — ONDANSETRON 4 MG PO TBDP
4.0000 mg | ORAL_TABLET | Freq: Once | ORAL | Status: AC
  Administered 2012-10-17: 4 mg via ORAL
  Filled 2012-10-17: qty 1

## 2012-10-17 MED ORDER — HYDROMORPHONE HCL PF 1 MG/ML IJ SOLN
1.0000 mg | Freq: Once | INTRAMUSCULAR | Status: AC
  Administered 2012-10-17: 1 mg via INTRAMUSCULAR
  Filled 2012-10-17: qty 1

## 2012-10-17 MED ORDER — DOXYCYCLINE HYCLATE 100 MG PO CAPS: 100.0000 mg | ORAL_CAPSULE | Freq: Two times a day (BID) | ORAL | Status: DC

## 2012-10-17 MED ORDER — CEFTRIAXONE SODIUM 250 MG IJ SOLR
250.0000 mg | Freq: Once | INTRAMUSCULAR | Status: AC
Start: 2012-10-17 — End: 2012-10-17
  Administered 2012-10-17: 250 mg via INTRAMUSCULAR
  Filled 2012-10-17: qty 250

## 2012-10-17 NOTE — ED Notes (Signed)
Pt states she has pain with urination.

## 2012-10-17 NOTE — ED Notes (Signed)
[place] tech called from [facility] stating she is waiting on a PA to do a procedure on an ICU patient on that campus and it will be 1-2 hours before she is able to get here.

## 2012-10-17 NOTE — ED Notes (Addendum)
Pt c/o generalized abdominal pain, a "little nausea," dysuria, and vaginal pressure x "a long while, maybe a couple months."  Pain score 8/10.  Hx of ovarian cyst.  Pt sts surgery to remove cyst scheduled for 10/10.

## 2012-10-17 NOTE — ED Notes (Signed)
PA at bedside to do pelvic 

## 2012-10-17 NOTE — ED Notes (Addendum)
Pt c/o lower abdominal and groin pain x2 weeks.  Pt has surgery scheduled for 10/30/12 to have cyst removed from left ovary.  Pt denies vomiting, diarrhea and fever, but endorses "a little" nausea that comes and goes.  Pt also c/o occasional loss of appetite, but appears well nourished.  Pt states yesterday she had "a smear," of blood on her panty liner, but "not a whole whole lot like last month."  Pt denies SOB, chest pain and dizziness.  Pt has good peripheral pulses.  Lung and heart sounds are normal.  Bowel sounds present and abdomen is soft, but "sore on the inside" to palpation.  Pt's last BM was yesterday and she states it was normal.    Pt has taken Tylenol to treat pain with no relief.  Pt rates pain 8/10 currently.

## 2012-10-17 NOTE — ED Provider Notes (Signed)
CSN: [id number]     Arrival date & time 10/17/12  1111 History   First MD Initiated Contact with Patient 10/17/12 1215     Chief Complaint  Patient presents with  . Abdominal Pain  . Nausea  . Vaginal pressure    (Consider location/radiation/quality/duration/timing/severity/associated sxs/prior Treatment) HPI Comments: 47 year old female the past medical history of hypertension, GERD, depression and ovarian cysts presents to the emergency department complaining of lower abdominal pain radiating to her vagina intermittently for the past 1-2 months, worsening over the past 2 weeks. Pain described as sharp and cramping rated 8/10, unrelieved by tylenol. She is scheduled for left ovarian cyst removal on October 10 with her gynecologist Dr. Su Hilt. States she saw Dr. Su Hilt earlier this month for the same problem and was told it was due to ovarian cysts. States she had an ultrasound at the time, however I am unable to find it in her records. She has had ultrasounds within the past year, last 12/2011 showing resolved L ovarian cyst. Hx of partial hysterectomy. Admits to associated nausea without vomiting, has slight decreased appetite from nausea. Had a spot of blood on panty liner throughout the past month, denies any obvious vaginal bleeding. Admits to dyspareunia. Denies vaginal discharge, urinary changes or any changes in bowel habits.   Patient is a 47 y.o. female presenting with abdominal pain. The history is provided by the patient.  Abdominal Pain Associated symptoms: nausea   Associated symptoms: no chest pain, no chills, no constipation, no diarrhea, no dysuria, no fever, no hematuria, no shortness of breath, no vaginal bleeding, no vaginal discharge and no vomiting     Past Medical History  Diagnosis Date  . Hypertension   . Hypercholesteremia   . Depression   . Low blood potassium   . GERD (gastroesophageal reflux disease)   . Ovarian cyst    Past Surgical History  Procedure  Laterality Date  . Partial hysterectomy  2009   Family History  Problem Relation Age of Onset  . Cancer Paternal Grandmother 12    bone  . Diabetes Paternal Grandmother   . Diabetes Father    History  Substance Use Topics  . Smoking status: Never Smoker   . Smokeless tobacco: Never Used  . Alcohol Use: No   OB History   Grav Para Term Preterm Abortions TAB SAB Ect Mult Living   3 2   1     2      Review of Systems  Constitutional: Positive for appetite change. Negative for fever and chills.  Respiratory: Negative for shortness of breath.   Cardiovascular: Negative for chest pain.  Gastrointestinal: Positive for nausea and abdominal pain. Negative for vomiting, diarrhea and constipation.  Genitourinary: Positive for vaginal pain, pelvic pain and dyspareunia. Negative for dysuria, frequency, hematuria, flank pain, vaginal bleeding and vaginal discharge.  Musculoskeletal: Negative for back pain.  All other systems reviewed and are negative.    Allergies  Review of patient's allergies indicates no known allergies.  Home Medications   Current Outpatient Rx  Name  Route  Sig  Dispense  Refill  . acetaminophen (TYLENOL) 500 MG tablet   Oral   Take 1,000 mg by mouth every 6 (six) hours as needed for pain.         Marland Kitchen amLODipine (NORVASC) 10 MG tablet   Oral   Take 10 mg by mouth every morning.         Marland Kitchen atorvastatin (LIPITOR) 20 MG tablet   Oral  Take 20 mg by mouth every evening.          . carvedilol (COREG) 6.25 MG tablet   Oral   Take 6.25 mg by mouth 2 (two) times daily with a meal.         . cetirizine (ZYRTEC) 10 MG tablet   Oral   Take 10 mg by mouth every morning.         . furosemide (LASIX) 40 MG tablet   Oral   Take 40 mg by mouth every morning.          . Melatonin 5 MG CAPS   Oral   Take 1 capsule by mouth at bedtime.         Marland Kitchen omeprazole (PRILOSEC) 20 MG capsule   Oral   Take 20 mg by mouth every morning.         . potassium  chloride (K-DUR) 10 MEQ tablet   Oral   Take 20 mEq by mouth daily.          . valsartan (DIOVAN) 80 MG tablet   Oral   Take 80 mg by mouth every morning.          BP 149/96  Pulse 86  Temp(Src) 98.5 F (36.9 C) (Oral)  SpO2 92% Physical Exam  Nursing note and vitals reviewed. Constitutional: She is oriented to person, place, and time. She appears well-developed and well-nourished. No distress.  HENT:  Head: Normocephalic and atraumatic.  Mouth/Throat: Oropharynx is clear and moist.  Eyes: Conjunctivae are normal.  Neck: Normal range of motion. Neck supple.  Cardiovascular: Normal rate, regular rhythm and normal heart sounds.   Pulmonary/Chest: Effort normal and breath sounds normal.  Abdominal: Soft. Normal appearance and bowel sounds are normal. She exhibits no distension and no mass. There is tenderness (suprapubic, lower quadrants). There is no rigidity, no rebound, no guarding and no CVA tenderness.  No peritoneal signs.  Genitourinary: There is no rash, tenderness or lesion on the right labia. There is no rash, tenderness or lesion on the left labia. Cervix exhibits no motion tenderness, no discharge and no friability. Right adnexum displays tenderness. Right adnexum displays no mass and no fullness. Left adnexum displays tenderness. Left adnexum displays no mass and no fullness. No erythema, tenderness or bleeding around the vagina. Vaginal discharge (copious, thick, white) found.  Musculoskeletal: Normal range of motion. She exhibits no edema.  Neurological: She is alert and oriented to person, place, and time.  Skin: Skin is warm and dry. She is not diaphoretic.  Psychiatric: She has a normal mood and affect. Her behavior is normal.    ED Course  Procedures (including critical care time) Labs Review Labs Reviewed  WET PREP, GENITAL - Abnormal; Notable for the following:    Clue Cells Wet Prep HPF POC MANY (*)    WBC, Wet Prep HPF POC FEW (*)    All other  components within normal limits  URINALYSIS, ROUTINE W REFLEX MICROSCOPIC - Abnormal; Notable for the following:    APPearance CLOUDY (*)    All other components within normal limits  GC/CHLAMYDIA PROBE AMP   Imaging Review US Transvaginal Non-ob  10/17/2012   CLINICAL DATA:  Pelvic pain.  EXAM: TRANSABDOMINAL AND TRANSVAGINAL ULTRASOUND OF PELVIS  TECHNIQUE: Both transabdominal and transvaginal ultrasound examinations of the pelvis were performed. Transabdominal technique was performed for global imaging of the pelvis including uterus, ovaries, adnexal regions, and pelvic cul-de-sac. It was necessary to proceed with endovaginal exam following the  transabdominal exam to visualize the vaginal cuff and adnexae.  COMPARISON:  None  FINDINGS: Uterus  Measurements: Surgically absent. Vaginal cuff is unremarkable in appearance.  Endometrium  Thickness: Not applicable  Right ovary  Measurements: Not visualized. History of prior right oophorectomy.  Left ovary  Measurements: No normal ovary visualized. A cystic lesion with a fine reticular pattern of internal echoes is seen which 3.9 x 3.0 x 4.0 cm and has characteristics consistent with a benign hemorrhagic ovarian cyst. In addition, there is a tubular cystic structure adjacent to this cystic lesion which is consistent with a hydrosalpinx.  Other findings  No free fluid.  IMPRESSION: 4 cm hemorrhagic left ovarian cyst and adjacent hydrosalpinx.  Prior hysterectomy and right oophorectomy.   Electronically Signed   By: Myles Rosenthal   On: 10/17/2012 15:22    MDM   1. BV (bacterial vaginosis)   2. Ovarian cyst   3. Hydrosalpinx     Patient with lower abdominal/pelvic pain. Hx of ovarian cysts. Large amount of thick white vaginal discharge on exam. Bilateral adnexal tenderness. Pelvic ultrasound obtained, showing 4 cm left hemorrhagic ovarian cyst with associated hydrosalpinx. Given the amount of vaginal discharge and hydrosalpinx, will treat for PID. Also has  clue cells. Discharge with Flagyl, doxycycline. 250 mg IM Rocephin given. Infection care and precautions discussed. Return precautions discussed. Patient states understanding of plan and is agreeable.   Trevor Mace, PA-C 10/17/12 [phone]

## 2012-10-18 LAB — GC/CHLAMYDIA PROBE AMP
CT Probe RNA: NEGATIVE
GC Probe RNA: NEGATIVE

## 2012-10-19 DIAGNOSIS — F333 Major depressive disorder, recurrent, severe with psychotic symptoms: Secondary | ICD-10-CM | POA: Diagnosis not present

## 2012-10-20 NOTE — ED Provider Notes (Signed)
Medical screening examination/treatment/procedure(s) were conducted as a shared visit with non-physician practitioner(s) and myself.  I personally evaluated the patient during the encounter Pt c/o lower abd pain. No fever or chills. abd soft nt. U/s and labs.   Suzi Roots, MD 10/20/12 [phone]

## 2012-10-21 ENCOUNTER — Other Ambulatory Visit: Payer: Self-pay | Admitting: Obstetrics and Gynecology

## 2012-10-21 NOTE — H&P (Signed)
Barbara [name] is a 46 y.o.  female.,  P 2-0-2-2,  presents laparoscopic cystectomy because of pelvic pain and ovarian cyst.  Patient underwent a partial left salpingo-oophorectomy with lysis of adhesions  in 2009 for pelvic pain and a complex ovarian cyst and was pain free until this year.  For over 6 months she's experienced pelvic pain on her right that was  worse with intercourse and accompanied by significant bloating.  She denies any changes in bowel or bladder habits and knows of no other activity that increases her symptoms.  A pelvic ultrasound 05/2012 showed a left ovary-3.69 x 2.13 x 2.61 cm with a simple cyst measuring 3.0 x 1.9 x 1.5 cm.  In 08/2012 a follow up ultrasound showed #2 left cysts, a simple-2.9 x1.8x1.8 cm and a hemorrhagi-4.27 x 4.55 x 3.62 cm. An OVA-1 test was negative.  After reviewing the persistent ultrasound findings and considering the  persistence of her pelvic pain, the patient has decided to proceed with surgical management of the ovarian cyst.  Past Medical History  OB History: G3;  P 2-0--1-2;  SVD  in 1986  andd 1991 and first trimester elective termination  GYN History: menarche : 47 YO;   The patient denies history of sexually transmitted disease.  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 acetaminophen (TYLENOL) 500 MG  tablet Take 1,000 mg by mouth every 6 (six) hours as needed for pain.      . amLODipine (NORVASC) 10 MG tablet Take 10 mg by mouth every morning.      . atorvastatin (LIPITOR) 20 MG tablet Take 20 mg by mouth every evening.       . carvedilol (COREG) 6.25 MG tablet Take 6.25 mg by mouth 2 (two) times daily with a meal.      . cetirizine (ZYRTEC) 10 MG tablet Take 10 mg by mouth every morning.      . doxycycline (VIBRAMYCIN) 100 MG capsule Take 1 capsule (100 mg total) by mouth 2 (two) times daily. One po bid x 7 days  14 capsule  0  . furosemide (LASIX) 40 MG tablet Take 40 mg by mouth every morning.       . Melatonin 5 MG CAPS Take 1 capsule by mouth at bedtime.      . metroNIDAZOLE (FLAGYL) 500 MG tablet Take 1 tablet (500 mg total) by mouth 2 (two) times daily. One po bid x 7 days  14 tablet  0  . omeprazole (PRILOSEC) 20 MG capsule Take 20 mg by mouth every morning.      . potassium chloride (K-DUR) 10 MEQ tablet Take 20 mEq by mouth daily.       . valsartan (DIOVAN) 80 MG tablet Take 80 mg by mouth every morning.       No facility-administered encounter medications on file as of 10/21/2012.  Citalopram 10 mg daily   Allergies/Sensitivities: Celexa-headache                                            Lisinopril-swelling and photosensitivity          Paxil-headache         Zoloft-headache  Denies sensitivity to peanuts, shellfish, soy, latex or adhesives.   ROS: Admits to glasses, lower back pain, right hip and knee pain but  denies headache, vision changes, nasal congestion, dysphagia, tinnitus, dizziness, hoarseness, cough,  chest pain, shortness of breath, nausea, vomiting, diarrhea,constipation,  urinary frequency, urgency  dysuria, hematuria, vaginitis symptoms, pelvic pain, swelling of joints,easy bruising,  myalgias, arthralgias, skin rashes, unexplained weight loss and except as is mentioned in the history of present illness, patient's review of systems is otherwise  negative.     Physical Exam  Bp: 126/82       P: 64    R: 13     Temperature: 98.8 degrees F orally    Weight:  180lbs   Height: 5'2"     BMI: 32.9  Neck: supple without masses or thyromegaly Lungs: clear to auscultation Heart: regular rate and rhythm Abdomen: soft, non-tender and no organomegaly Pelvic:EGBUS- wnl; vagina-normal rugae; uterus/cervix-surgically absent, adnexae-no tenderness or masses Extremities:  no clubbing, cyanosis or edema   Assesment:  Pelvic Pain                       Ovarian Cyst                       S/P Hysterectomy   Disposition:  A discussion was held with patient regarding the indication for her procedure(s) along with the risks, which include but are not limited to: reaction to anesthesia, damage to adjacent organs, infection and excessive bleeding.  The patient verbalized understanding of these risks and has consented to  proceed with a Laparoscopic Ovarian Cystectomy with possible Oophorectomy at [facility], October 30, 2012 at 10 a.m.   CSN# [id number]   Alfie Alderfer J. Gabriel Paulding, PA-C  for Dr. Angela Y.Roberts   

## 2012-10-22 ENCOUNTER — Encounter (HOSPITAL_COMMUNITY)
Admission: RE | Admit: 2012-10-22 | Discharge: 2012-10-22 | Disposition: A | Discharge status: Home / Self Care | Payer: Medicare Other | Source: Ambulatory Visit | Attending: Obstetrics and Gynecology | Admitting: Obstetrics and Gynecology

## 2012-10-22 ENCOUNTER — Encounter (HOSPITAL_COMMUNITY): Payer: Self-pay

## 2012-10-22 DIAGNOSIS — Z01818 Encounter for other preprocedural examination: Secondary | ICD-10-CM | POA: Insufficient documentation

## 2012-10-22 DIAGNOSIS — Z01812 Encounter for preprocedural laboratory examination: Secondary | ICD-10-CM | POA: Insufficient documentation

## 2012-10-22 HISTORY — DX: Sleep apnea, unspecified: G47.30

## 2012-10-22 LAB — BASIC METABOLIC PANEL
CO2: 27 mEq/L (ref 19–32)
Calcium: 9.7 mg/dL (ref 8.4–10.5)
Chloride: 102 mEq/L (ref 96–112)
Creatinine, Ser: 0.66 mg/dL (ref 0.50–1.10)
GFR calc Af Amer: >90 mL/min (ref >90)
Sodium: 141 mEq/L (ref 135–145)

## 2012-10-22 LAB — CBC
MCH: 27.9 pg (ref 26.0–34.0)
MCV: 83.8 fL (ref 78.0–100.0)
Platelets: 249 10*3/uL (ref 150–400)
RBC: 4.7 MIL/uL (ref 3.87–5.11)
RDW: 14.9 % (ref 11.5–15.5)
WBC: 9.7 10*3/uL (ref 4.0–10.5)

## 2012-10-22 NOTE — Pre-Procedure Instructions (Signed)
Call from Adrienne/MD office to verify K+ report received. No orders given.

## 2012-10-22 NOTE — Patient Instructions (Addendum)
20 Barbara [name]  10/22/2012   Your procedure is scheduled on:  10/30/12  Enter through the Main Entrance of [facility] at 830 AM.  Pick up the phone at the desk and dial [phone].   Call this number if you have problems the morning of surgery: [phone]   Remember:   Do not eat food:After Midnight.  Do not drink clear liquids: After Midnight.  Take these medicines the morning of surgery with A SIP OF WATER: take blood pressure medications, reflux medication and Klor. potassium   Do not wear jewelry, make-up or nail polish.  Do not wear lotions, powders, or perfumes. You may wear deodorant.  Do not shave 48 hours prior to surgery.  Do not bring valuables to the hospital.  [facility] is not   responsible for any belongings or valuables brought to the hospital.  Contacts, dentures or bridgework may not be worn into surgery.  Leave suitcase in the car. After surgery it may be brought to your room.  For patients admitted to the hospital, checkout time is 11:00 AM the day of              discharge.   Patients discharged the day of surgery will not be allowed to drive             home.  Name and phone number of your driver: undecided  Special Instructions:   Shower using CHG 2 nights before surgery and the night before surgery.  If you shower the day of surgery use CHG.  Use special wash - you have one bottle of CHG for all showers.  You should use approximately 1/3 of the bottle for each shower.   Please read over the following fact sheets that you were given:   Surgical Site Infection Prevention

## 2012-10-22 NOTE — Pre-Procedure Instructions (Signed)
Potassium of 2.8 called to Dr Malen Gauze and voice mail left for Hansel Starling (Dr Su Hilt) with inst to verify rec of result.

## 2012-10-30 ENCOUNTER — Encounter (HOSPITAL_COMMUNITY): Payer: Self-pay | Admitting: Certified Registered"

## 2012-10-30 ENCOUNTER — Encounter (HOSPITAL_COMMUNITY): Payer: Medicare Other | Admitting: Anesthesiology

## 2012-10-30 ENCOUNTER — Ambulatory Visit (HOSPITAL_COMMUNITY)
Admission: RE | Admit: 2012-10-30 | Discharge: 2012-10-30 | Disposition: A | Discharge status: Home / Self Care | Payer: Medicare Other | Source: Ambulatory Visit | Attending: Obstetrics and Gynecology | Admitting: Obstetrics and Gynecology

## 2012-10-30 ENCOUNTER — Encounter (HOSPITAL_COMMUNITY)
Admission: RE | Disposition: A | Discharge status: Home / Self Care | Payer: Self-pay | Source: Ambulatory Visit | Attending: Obstetrics and Gynecology

## 2012-10-30 ENCOUNTER — Ambulatory Visit (HOSPITAL_COMMUNITY): Payer: Medicare Other | Admitting: Anesthesiology

## 2012-10-30 DIAGNOSIS — Z90710 Acquired absence of both cervix and uterus: Secondary | ICD-10-CM | POA: Diagnosis not present

## 2012-10-30 DIAGNOSIS — E7800 Pure hypercholesterolemia, unspecified: Secondary | ICD-10-CM | POA: Insufficient documentation

## 2012-10-30 DIAGNOSIS — N949 Unspecified condition associated with female genital organs and menstrual cycle: Secondary | ICD-10-CM | POA: Insufficient documentation

## 2012-10-30 DIAGNOSIS — I10 Essential (primary) hypertension: Secondary | ICD-10-CM | POA: Diagnosis not present

## 2012-10-30 DIAGNOSIS — N736 Female pelvic peritoneal adhesions (postinfective): Secondary | ICD-10-CM | POA: Diagnosis not present

## 2012-10-30 DIAGNOSIS — N83209 Unspecified ovarian cyst, unspecified side: Secondary | ICD-10-CM | POA: Insufficient documentation

## 2012-10-30 DIAGNOSIS — K219 Gastro-esophageal reflux disease without esophagitis: Secondary | ICD-10-CM | POA: Diagnosis not present

## 2012-10-30 HISTORY — PX: LAPAROSCOPY: SHX197

## 2012-10-30 HISTORY — PX: LAPAROSCOPIC LYSIS OF ADHESIONS: SHX5905

## 2012-10-30 HISTORY — PX: EXAMINATION UNDER ANESTHESIA: SHX1540

## 2012-10-30 LAB — COMPREHENSIVE METABOLIC PANEL
Albumin: 3.9 g/dL (ref 3.5–5.2)
BUN: 8 mg/dL (ref 6–23)
CO2: 25 mEq/L (ref 19–32)
Calcium: 9.6 mg/dL (ref 8.4–10.5)
Creatinine, Ser: 0.57 mg/dL (ref 0.50–1.10)
GFR calc Af Amer: >90 mL/min (ref >90)
GFR calc non Af Amer: >90 mL/min (ref >90)
Glucose, Bld: 127 mg/dL — ABNORMAL HIGH (ref 70–99)
Potassium: 3.2 mEq/L — ABNORMAL LOW (ref 3.5–5.1)

## 2012-10-30 SURGERY — EXAM UNDER ANESTHESIA
Anesthesia: General | Site: Vagina | Wound class: Clean Contaminated

## 2012-10-30 MED ORDER — PROPOFOL 10 MG/ML IV EMUL
INTRAVENOUS | Status: AC
  Filled 2012-10-30: qty 20

## 2012-10-30 MED ORDER — DEXAMETHASONE SODIUM PHOSPHATE 10 MG/ML IJ SOLN
INTRAMUSCULAR | Status: DC | PRN
  Administered 2012-10-30: 10 mg via INTRAVENOUS

## 2012-10-30 MED ORDER — PROPOFOL 10 MG/ML IV BOLUS
INTRAVENOUS | Status: DC | PRN
  Administered 2012-10-30: 180 mg via INTRAVENOUS

## 2012-10-30 MED ORDER — HEPARIN SODIUM (PORCINE) 5000 UNIT/ML IJ SOLN
INTRAMUSCULAR | Status: AC
  Filled 2012-10-30: qty 1

## 2012-10-30 MED ORDER — CEFAZOLIN SODIUM-DEXTROSE 2-3 GM-% IV SOLR
INTRAVENOUS | Status: AC
  Filled 2012-10-30: qty 50

## 2012-10-30 MED ORDER — OXYCODONE-ACETAMINOPHEN 5-325 MG PO TABS: 1.0000 | ORAL_TABLET | ORAL | Status: DC | PRN

## 2012-10-30 MED ORDER — LIDOCAINE HCL (CARDIAC) 20 MG/ML IV SOLN
INTRAVENOUS | Status: AC
  Filled 2012-10-30: qty 5

## 2012-10-30 MED ORDER — KETOROLAC TROMETHAMINE 30 MG/ML IJ SOLN
INTRAMUSCULAR | Status: AC
  Filled 2012-10-30: qty 1

## 2012-10-30 MED ORDER — KETOROLAC TROMETHAMINE 30 MG/ML IJ SOLN
INTRAMUSCULAR | Status: DC | PRN
  Administered 2012-10-30: 30 mg via INTRAVENOUS

## 2012-10-30 MED ORDER — LIDOCAINE HCL (CARDIAC) 20 MG/ML IV SOLN
INTRAVENOUS | Status: DC | PRN
  Administered 2012-10-30: 80 mg via INTRAVENOUS

## 2012-10-30 MED ORDER — IBUPROFEN 600 MG PO TABS: 600.0000 mg | ORAL_TABLET | Freq: Four times a day (QID) | ORAL | Status: DC | PRN

## 2012-10-30 MED ORDER — ROCURONIUM BROMIDE 100 MG/10ML IV SOLN
INTRAVENOUS | Status: DC | PRN
  Administered 2012-10-30: 10 mg via INTRAVENOUS
  Administered 2012-10-30: 40 mg via INTRAVENOUS

## 2012-10-30 MED ORDER — ROCURONIUM BROMIDE 50 MG/5ML IV SOLN
INTRAVENOUS | Status: AC
  Filled 2012-10-30: qty 1

## 2012-10-30 MED ORDER — GLYCOPYRROLATE 0.2 MG/ML IJ SOLN
INTRAMUSCULAR | Status: AC
  Filled 2012-10-30: qty 3

## 2012-10-30 MED ORDER — OXYCODONE-ACETAMINOPHEN 5-325 MG PO TABS
1.0000 | ORAL_TABLET | Freq: Once | ORAL | Status: AC
  Administered 2012-10-30: 1 via ORAL

## 2012-10-30 MED ORDER — FENTANYL CITRATE 0.05 MG/ML IJ SOLN
INTRAMUSCULAR | Status: DC | PRN
  Administered 2012-10-30 (×5): 50 ug via INTRAVENOUS

## 2012-10-30 MED ORDER — METOCLOPRAMIDE HCL 5 MG/ML IJ SOLN
INTRAMUSCULAR | Status: AC
  Filled 2012-10-30: qty 2

## 2012-10-30 MED ORDER — FENTANYL CITRATE 0.05 MG/ML IJ SOLN
25.0000 ug | INTRAMUSCULAR | Status: DC | PRN
  Administered 2012-10-30: 50 ug via INTRAVENOUS

## 2012-10-30 MED ORDER — FENTANYL CITRATE 0.05 MG/ML IJ SOLN
INTRAMUSCULAR | Status: AC
  Administered 2012-10-30: 50 ug via INTRAVENOUS
  Filled 2012-10-30: qty 2

## 2012-10-30 MED ORDER — NEOSTIGMINE METHYLSULFATE 1 MG/ML IJ SOLN
INTRAMUSCULAR | Status: AC
  Filled 2012-10-30: qty 1

## 2012-10-30 MED ORDER — ONDANSETRON HCL 4 MG/2ML IJ SOLN
INTRAMUSCULAR | Status: AC
  Filled 2012-10-30: qty 2

## 2012-10-30 MED ORDER — BUPIVACAINE HCL (PF) 0.25 % IJ SOLN
INTRAMUSCULAR | Status: DC | PRN
  Administered 2012-10-30: 17 mL

## 2012-10-30 MED ORDER — GLYCOPYRROLATE 0.2 MG/ML IJ SOLN
INTRAMUSCULAR | Status: AC
  Filled 2012-10-30: qty 1

## 2012-10-30 MED ORDER — NEOSTIGMINE METHYLSULFATE 1 MG/ML IJ SOLN
INTRAMUSCULAR | Status: DC | PRN
  Administered 2012-10-30: 5 mg via INTRAVENOUS

## 2012-10-30 MED ORDER — GLYCOPYRROLATE 0.2 MG/ML IJ SOLN
INTRAMUSCULAR | Status: DC | PRN
  Administered 2012-10-30: 1 mg via INTRAVENOUS

## 2012-10-30 MED ORDER — DEXAMETHASONE SODIUM PHOSPHATE 10 MG/ML IJ SOLN
INTRAMUSCULAR | Status: AC
  Filled 2012-10-30: qty 1

## 2012-10-30 MED ORDER — LACTATED RINGERS IV SOLN
INTRAVENOUS | Status: DC
  Administered 2012-10-30: 09:00:00 via INTRAVENOUS

## 2012-10-30 MED ORDER — LACTATED RINGERS IR SOLN
Status: DC | PRN
  Administered 2012-10-30: 3000 mL

## 2012-10-30 MED ORDER — ONDANSETRON HCL 4 MG/2ML IJ SOLN
INTRAMUSCULAR | Status: DC | PRN
  Administered 2012-10-30: 4 mg via INTRAMUSCULAR

## 2012-10-30 MED ORDER — MIDAZOLAM HCL 2 MG/2ML IJ SOLN
INTRAMUSCULAR | Status: DC | PRN
  Administered 2012-10-30: 2 mg via INTRAVENOUS

## 2012-10-30 MED ORDER — BUPIVACAINE HCL (PF) 0.25 % IJ SOLN
INTRAMUSCULAR | Status: AC
  Filled 2012-10-30: qty 30

## 2012-10-30 MED ORDER — OXYCODONE-ACETAMINOPHEN 5-325 MG PO TABS
ORAL_TABLET | ORAL | Status: AC
  Filled 2012-10-30: qty 1

## 2012-10-30 MED ORDER — FENTANYL CITRATE 0.05 MG/ML IJ SOLN
INTRAMUSCULAR | Status: AC
  Filled 2012-10-30: qty 5

## 2012-10-30 MED ORDER — CEFAZOLIN SODIUM-DEXTROSE 2-3 GM-% IV SOLR
2.0000 g | INTRAVENOUS | Status: AC
  Administered 2012-10-30: 2 g via INTRAVENOUS

## 2012-10-30 MED ORDER — MIDAZOLAM HCL 2 MG/2ML IJ SOLN
INTRAMUSCULAR | Status: AC
  Filled 2012-10-30: qty 2

## 2012-10-30 MED ORDER — METOCLOPRAMIDE HCL 5 MG/ML IJ SOLN
10.0000 mg | Freq: Once | INTRAMUSCULAR | Status: AC
  Administered 2012-10-30: 10 mg via INTRAVENOUS

## 2012-10-30 SURGICAL SUPPLY — 33 items
ADH SKN CLS APL DERMABOND .7 (GAUZE/BANDAGES/DRESSINGS) ×1
BAG SPEC RTRVL LRG 6X4 10 (ENDOMECHANICALS)
CABLE HIGH FREQUENCY MONO STRZ (ELECTRODE) IMPLANT
CHLORAPREP W/TINT 26ML (MISCELLANEOUS) ×3 IMPLANT
CLOTH BEACON ORANGE TIMEOUT ST (SAFETY) ×3 IMPLANT
DERMABOND ADVANCED (GAUZE/BANDAGES/DRESSINGS) ×1
DERMABOND ADVANCED .7 DNX12 (GAUZE/BANDAGES/DRESSINGS) ×2 IMPLANT
DISSECTOR BLUNT TIP ENDO 5MM (MISCELLANEOUS) ×3 IMPLANT
FORCEPS CUTTING 33CM 5MM (CUTTING FORCEPS) IMPLANT
FORCEPS CUTTING 45CM 5MM (CUTTING FORCEPS) IMPLANT
GLOVE BIO SURGEON STRL SZ7.5 (GLOVE) ×3 IMPLANT
GLOVE BIOGEL PI IND STRL 7.5 (GLOVE) ×2 IMPLANT
GLOVE BIOGEL PI INDICATOR 7.5 (GLOVE) ×1
GOWN STRL REIN XL XLG (GOWN DISPOSABLE) ×3 IMPLANT
NEEDLE INSUFFLATION 120MM (ENDOMECHANICALS) IMPLANT
NS IRRIG 1000ML POUR BTL (IV SOLUTION) ×3 IMPLANT
PACK LAPAROSCOPY BASIN (CUSTOM PROCEDURE TRAY) ×3 IMPLANT
POUCH SPECIMEN RETRIEVAL 10MM (ENDOMECHANICALS) IMPLANT
PROTECTOR NERVE ULNAR (MISCELLANEOUS) ×3 IMPLANT
SET IRRIG TUBING LAPAROSCOPIC (IRRIGATION / IRRIGATOR) IMPLANT
SOLUTION ELECTROLUBE (MISCELLANEOUS) IMPLANT
SUT MON AB 4-0 PS1 27 (SUTURE) ×3 IMPLANT
SUT VICRYL 0 ENDOLOOP (SUTURE) IMPLANT
SUT VICRYL 0 TIES 12 18 (SUTURE) IMPLANT
SUT VICRYL 0 UR6 27IN ABS (SUTURE) ×3 IMPLANT
SYR 50ML LL SCALE MARK (SYRINGE) IMPLANT
TOWEL OR 17X24 6PK STRL BLUE (TOWEL DISPOSABLE) ×6 IMPLANT
TRAY FOLEY CATH 14FR (SET/KITS/TRAYS/PACK) ×3 IMPLANT
TROCAR XCEL NON-BLD 11X100MML (ENDOMECHANICALS) ×3 IMPLANT
TROCAR XCEL NON-BLD 5MMX100MML (ENDOMECHANICALS) ×3 IMPLANT
TROCAR XCEL OPT SLVE 5M 100M (ENDOMECHANICALS) ×3 IMPLANT
WARMER LAPAROSCOPE (MISCELLANEOUS) ×3 IMPLANT
WATER STERILE IRR 1000ML POUR (IV SOLUTION) ×3 IMPLANT

## 2012-10-30 NOTE — Progress Notes (Signed)
New pyxis will not allow narcotic waste to be entered after patient discharged. Instructed by pharmacy to enter in a progress note until this issue is resolved. Pt given of Fentanyl, wasted. Helene Shoe RN witnessed this waste in sink.

## 2012-10-30 NOTE — Preoperative (Signed)
Beta Blockers   Reason not to administer Beta Blockers:Coreg taken 10/10 @ 0700

## 2012-10-30 NOTE — Anesthesia Postprocedure Evaluation (Signed)
  Anesthesia Post-op Note  Patient: Barbara [name]  Procedure(s) Performed: Procedure(s): EXAM UNDER ANESTHESIA (N/A) LAPAROSCOPIC LYSIS OF ADHESIONS (N/A) LAPAROSCOPY OPERATIVE (N/A) Patient is awake and responsive. Pain and nausea are reasonably well controlled. Vital signs are stable and clinically acceptable. Oxygen saturation is clinically acceptable. There are no apparent anesthetic complications at this time. Patient is ready for discharge.

## 2012-10-30 NOTE — Interval H&P Note (Signed)
History and Physical Interval Note:  10/30/2012 10:05 AM  Barbara [name]  has presented today for surgery, with the diagnosis of Ovarian Cyst  The various methods of treatment have been discussed with the patient and family. After consideration of risks, benefits and other options for treatment, the patient has consented to  Procedure(s): LAPAROSCOPY OPERATIVE OVARIAN CYSTECTOMY (N/A) Possbile Oophorectomy and EUA as a surgical intervention .  The patient's history has been reviewed, patient examined, no change in status, stable for surgery.  I have reviewed the patient's chart and labs.  Questions were answered to the patient's satisfaction.     Barbara [name]  The patient actually has a h/o RSO.  Her pain and the persistent cysts were on the left.  This surgery is being done for evaluation and treatment (ie LOA, left cyctectomy...).  The patient reported an episode of vaginal bleeding enough to get on a pad and with her h/o hysterectomy, I will do exam under anesthesia to evaluate that as well.  R/B/A reviewed with patient, patient verbalized understanding and consent signed and witnessed.

## 2012-10-30 NOTE — Anesthesia Preprocedure Evaluation (Addendum)
Anesthesia Evaluation  Patient identified by MRN, date of birth, ID band Patient awake    Reviewed: Allergy & Precautions, H&P , Patient's Chart, lab work & pertinent test results, reviewed documented beta blocker date and time   Airway Mallampati: II TM Distance: >3 FB Neck ROM: full   Comment: Large tonsils, (+)  Hx of snoring, OSA Dental no notable dental hx.    Pulmonary sleep apnea and Continuous Positive Airway Pressure Ventilation ,  breath sounds clear to auscultation  Pulmonary exam normal       Cardiovascular hypertension (Normal EKG, no CAD Sx), On Medications and On Home Beta Blockers Rhythm:regular Rate:Normal     Neuro/Psych    GI/Hepatic Medicated,  Endo/Other    Renal/GU      Musculoskeletal   Abdominal   Peds  Hematology   Anesthesia Other Findings   Reproductive/Obstetrics                          Anesthesia Physical Anesthesia Plan  ASA: II  Anesthesia Plan: General   Post-op Pain Management:    Induction: Intravenous  Airway Management Planned: Oral ETT  Additional Equipment:   Intra-op Plan:   Post-operative Plan: Extubation in OR  Informed Consent: I have reviewed the patients History and Physical, chart, labs and discussed the procedure including the risks, benefits and alternatives for the proposed anesthesia with the patient or authorized representative who has indicated his/her understanding and acceptance.   Dental Advisory Given and Dental advisory given  Plan Discussed with: CRNA and Surgeon  Anesthesia Plan Comments: (  Discussed general anesthesia, including possible nausea, instrumentation of airway, sore throat,pulmonary aspiration, etc. I asked if the were any outstanding questions, or  concerns before we proceeded. )        Anesthesia Quick Evaluation

## 2012-10-30 NOTE — H&P (View-Only) (Signed)
Barbara [name] is a 47 y.o.  female.,  P 2-0-2-2,  presents laparoscopic cystectomy because of pelvic pain and ovarian cyst.  Patient underwent a partial left salpingo-oophorectomy with lysis of adhesions  in 2009 for pelvic pain and a complex ovarian cyst and was pain free until this year.  For over 6 months she's experienced pelvic pain on her right that was  worse with intercourse and accompanied by significant bloating.  She denies any changes in bowel or bladder habits and knows of no other activity that increases her symptoms.  A pelvic ultrasound 05/2012 showed a left ovary-3.69 x 2.13 x 2.61 cm with a simple cyst measuring 3.0 x 1.9 x 1.5 cm.  In 08/2012 a follow up ultrasound showed #2 left cysts, a simple-2.9 x1.8x1.8 cm and a hemorrhagi-4.27 x 4.55 x 3.62 cm. An OVA-1 test was negative.  After reviewing the persistent ultrasound findings and considering the  persistence of her pelvic pain, the patient has decided to proceed with surgical management of the ovarian cyst.  Past Medical History  OB History: G3;  P 2-0--1-2;  SVD  in 1986  andd 1991 and first trimester elective termination  GYN History: menarche : 47 YO;   The patient denies history of sexually transmitted disease.  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 acetaminophen (TYLENOL) 500 MG  tablet Take 1,000 mg by mouth every 6 (six) hours as needed for pain.      Marland Kitchen amLODipine (NORVASC) 10 MG tablet Take 10 mg by mouth every morning.      Marland Kitchen atorvastatin (LIPITOR) 20 MG tablet Take 20 mg by mouth every evening.       . carvedilol (COREG) 6.25 MG tablet Take 6.25 mg by mouth 2 (two) times daily with a meal.      . cetirizine (ZYRTEC) 10 MG tablet Take 10 mg by mouth every morning.      Marland Kitchen doxycycline (VIBRAMYCIN) 100 MG capsule Take 1 capsule (100 mg total) by mouth 2 (two) times daily. One po bid x 7 days  14 capsule  0  . furosemide (LASIX) 40 MG tablet Take 40 mg by mouth every morning.       . Melatonin 5 MG CAPS Take 1 capsule by mouth at bedtime.      . metroNIDAZOLE (FLAGYL) 500 MG tablet Take 1 tablet (500 mg total) by mouth 2 (two) times daily. One po bid x 7 days  14 tablet  0  . omeprazole (PRILOSEC) 20 MG capsule Take 20 mg by mouth every morning.      . potassium chloride (K-DUR) 10 MEQ tablet Take 20 mEq by mouth daily.       . valsartan (DIOVAN) 80 MG tablet Take 80 mg by mouth every morning.       No facility-administered encounter medications on file as of 10/21/2012.  Citalopram 10 mg daily   Allergies/Sensitivities: Celexa-headache  Lisinopril-swelling and photosensitivity          Paxil-headache         Zoloft-headache  Denies sensitivity to peanuts, shellfish, soy, latex or adhesives.   ROS: Admits to glasses, lower back pain, right hip and knee pain but  denies headache, vision changes, nasal congestion, dysphagia, tinnitus, dizziness, hoarseness, cough,  chest pain, shortness of breath, nausea, vomiting, diarrhea,constipation,  urinary frequency, urgency  dysuria, hematuria, vaginitis symptoms, pelvic pain, swelling of joints,easy bruising,  myalgias, arthralgias, skin rashes, unexplained weight loss and except as is mentioned in the history of present illness, patient's review of systems is otherwise  negative.     Physical Exam  Bp: 126/82       P: 64    R: 13     Temperature: 98.8 degrees F orally    Weight:  180lbs   Height: 5'2"     BMI: 32.9  Neck: supple without masses or thyromegaly Lungs: clear to auscultation Heart: regular rate and rhythm Abdomen: soft, non-tender and no organomegaly Pelvic:EGBUS- wnl; vagina-normal rugae; uterus/cervix-surgically absent, adnexae-no tenderness or masses Extremities:  no clubbing, cyanosis or edema   Assesment:  Pelvic Pain                       Ovarian Cyst                       S/P Hysterectomy   Disposition:  A discussion was held with patient regarding the indication for her procedure(s) along with the risks, which include but are not limited to: reaction to anesthesia, damage to adjacent organs, infection and excessive bleeding.  The patient verbalized understanding of these risks and has consented to  proceed with a Laparoscopic Ovarian Cystectomy with possible Oophorectomy at [facility] [place], October 30, 2012 at 10 a.m.   CSN# [id number]   Wyndell Cardiff J. Lowell Guitar, PA-C  for Dr. Woodroe Mode.Su Hilt

## 2012-10-30 NOTE — Op Note (Signed)
Preop Diagnosis: 1.Ovarian Cyst 2.Pelvic Pain   Postop Diagnosis:  1.Pelvic Adhesions 2.Pelvic Pain  Procedure: LAPAROSCOPIC LYSIS OF ADHESIONS  Anesthesia: General   Anesthesiologist: Dr. Cristela Blue  Attending: Purcell Nails, MD   Assistant:  EP  Findings: Multiple adhesions of bowel to the left pelvic side wall and bladder, no cysts noted and the patient has a rt ovary but no left ovary or tube  Pathology: N/a  Fluids: See flowsheet  UOP: See flowsheet  EBL: See flowsheet  Complications: None  Procedure: The patient was taken to the operating room after the risks, benefits, alternatives, complications, treatment options, and expected outcomes were discussed with the patient. The patient verbalized understanding, the patient concurred with the proposed plan and consent signed and witnessed. The patient was taken to the Operating Room, identified as Barbara [name] and the procedure verified as laparoscopic ovarian cystectomy. A Time Out was held and the above information confirmed.  The patient was placed under general anesthesia per anesthesia staff, the patient was placed in modified dorsal lithotomy position and was prepped, draped, and catheterized in the normal, sterile fashion.  An exam under anesthesia was performed by placing sterile speculum in vagina.  No bleeding or abnormal findings noted.  The cervix was visualized and an intrauterine manipulator was placed. A  10 mm umbilical incision was then performed. Veress needle was passed and pneumoperitoneum was established. A 10 mm trocar was advanced into the intraabdominal cavity, the laparoscope was introduced and findings as noted above.  Patient was placed in trendelenburg and marcaine injected in the RLQ and a 5 mm incision was made and 5 mm trocar advanced into the intraabdominal cavity.  The same was done in the suprapubic area.   The adhesions to the left pelvic sidewall were excised enough to see that  area where the ovary would have been.  The right ovary was clearly visualized and appeared to be wnl and no cysts were noted.  All trocars were removed under direct visualization and pneumoperitoneum was relieved.  Sponge, lap and needle count was correct.  Pt tolerated the procedure well and was returned to the recovery room in good condition.

## 2012-10-30 NOTE — Transfer of Care (Signed)
Immediate Anesthesia Transfer of Care Note  Patient: Barbara [name]  Procedure(s) Performed: Procedure(s): EXAM UNDER ANESTHESIA (N/A) LAPAROSCOPIC LYSIS OF ADHESIONS (N/A) LAPAROSCOPY OPERATIVE (N/A)  Patient Location: PACU  Anesthesia Type:General  Level of Consciousness: awake, alert  and oriented  Airway & Oxygen Therapy: Patient Spontanous Breathing and Patient connected to nasal cannula oxygen  Post-op Assessment: Report given to PACU RN, Post -op Vital signs reviewed and stable and Patient moving all extremities  Post vital signs: Reviewed and stable  Complications: No apparent anesthesia complications

## 2012-11-02 ENCOUNTER — Encounter (HOSPITAL_COMMUNITY): Payer: Self-pay | Admitting: Obstetrics and Gynecology

## 2012-11-13 DIAGNOSIS — N9489 Other specified conditions associated with female genital organs and menstrual cycle: Secondary | ICD-10-CM | POA: Diagnosis not present

## 2012-11-24 DIAGNOSIS — G4733 Obstructive sleep apnea (adult) (pediatric): Secondary | ICD-10-CM | POA: Diagnosis not present

## 2012-12-01 DIAGNOSIS — F333 Major depressive disorder, recurrent, severe with psychotic symptoms: Secondary | ICD-10-CM | POA: Diagnosis not present

## 2012-12-01 NOTE — OR Nursing (Signed)
Procedure End Time is 1140 am. Procedure was performed on 10/30/2012.

## 2013-01-18 DIAGNOSIS — Z1231 Encounter for screening mammogram for malignant neoplasm of breast: Secondary | ICD-10-CM | POA: Diagnosis not present

## 2013-01-19 DIAGNOSIS — F332 Major depressive disorder, recurrent severe without psychotic features: Secondary | ICD-10-CM | POA: Diagnosis not present

## 2013-02-04 DIAGNOSIS — M25569 Pain in unspecified knee: Secondary | ICD-10-CM | POA: Diagnosis not present

## 2013-02-04 DIAGNOSIS — M25469 Effusion, unspecified knee: Secondary | ICD-10-CM | POA: Diagnosis not present

## 2013-02-15 DIAGNOSIS — E7800 Pure hypercholesterolemia, unspecified: Secondary | ICD-10-CM | POA: Diagnosis not present

## 2013-02-15 DIAGNOSIS — I10 Essential (primary) hypertension: Secondary | ICD-10-CM | POA: Diagnosis not present

## 2013-02-15 DIAGNOSIS — E119 Type 2 diabetes mellitus without complications: Secondary | ICD-10-CM | POA: Diagnosis not present

## 2013-03-02 DIAGNOSIS — E876 Hypokalemia: Secondary | ICD-10-CM | POA: Diagnosis not present

## 2013-03-04 DIAGNOSIS — H40039 Anatomical narrow angle, unspecified eye: Secondary | ICD-10-CM | POA: Diagnosis not present

## 2013-03-04 DIAGNOSIS — G44209 Tension-type headache, unspecified, not intractable: Secondary | ICD-10-CM | POA: Diagnosis not present

## 2013-03-08 ENCOUNTER — Encounter (HOSPITAL_COMMUNITY): Payer: Self-pay | Admitting: Emergency Medicine

## 2013-03-08 ENCOUNTER — Emergency Department (HOSPITAL_COMMUNITY)
Admission: EM | Admit: 2013-03-08 | Discharge: 2013-03-08 | Disposition: A | Discharge status: Home / Self Care | Payer: Medicare Other | Attending: Emergency Medicine | Admitting: Emergency Medicine

## 2013-03-08 ENCOUNTER — Emergency Department (HOSPITAL_COMMUNITY): Payer: Medicare Other

## 2013-03-08 DIAGNOSIS — I10 Essential (primary) hypertension: Secondary | ICD-10-CM | POA: Insufficient documentation

## 2013-03-08 DIAGNOSIS — Z79899 Other long term (current) drug therapy: Secondary | ICD-10-CM | POA: Diagnosis not present

## 2013-03-08 DIAGNOSIS — K219 Gastro-esophageal reflux disease without esophagitis: Secondary | ICD-10-CM | POA: Insufficient documentation

## 2013-03-08 DIAGNOSIS — F3289 Other specified depressive episodes: Secondary | ICD-10-CM | POA: Diagnosis not present

## 2013-03-08 DIAGNOSIS — Z90711 Acquired absence of uterus with remaining cervical stump: Secondary | ICD-10-CM | POA: Diagnosis not present

## 2013-03-08 DIAGNOSIS — R109 Unspecified abdominal pain: Secondary | ICD-10-CM | POA: Diagnosis not present

## 2013-03-08 DIAGNOSIS — R1011 Right upper quadrant pain: Secondary | ICD-10-CM | POA: Insufficient documentation

## 2013-03-08 DIAGNOSIS — Z8742 Personal history of other diseases of the female genital tract: Secondary | ICD-10-CM | POA: Diagnosis not present

## 2013-03-08 DIAGNOSIS — E7800 Pure hypercholesterolemia, unspecified: Secondary | ICD-10-CM | POA: Insufficient documentation

## 2013-03-08 DIAGNOSIS — F329 Major depressive disorder, single episode, unspecified: Secondary | ICD-10-CM | POA: Diagnosis not present

## 2013-03-08 DIAGNOSIS — R110 Nausea: Secondary | ICD-10-CM | POA: Diagnosis not present

## 2013-03-08 LAB — COMPREHENSIVE METABOLIC PANEL
ALBUMIN: 3.9 g/dL (ref 3.5–5.2)
ALK PHOS: 91 U/L (ref 39–117)
ALT: 38 U/L — AB (ref 0–35)
AST: 43 U/L — AB (ref 0–37)
BILIRUBIN TOTAL: 0.4 mg/dL (ref 0.3–1.2)
BUN: 9 mg/dL (ref 6–23)
CHLORIDE: 104 meq/L (ref 96–112)
CO2: 27 meq/L (ref 19–32)
CREATININE: 0.68 mg/dL (ref 0.50–1.10)
Calcium: 9.8 mg/dL (ref 8.4–10.5)
GFR calc Af Amer: >90 mL/min (ref >90)
Glucose, Bld: 124 mg/dL — ABNORMAL HIGH (ref 70–99)
POTASSIUM: 3.2 meq/L — AB (ref 3.7–5.3)
SODIUM: 144 meq/L (ref 137–147)
Total Protein: 7.5 g/dL (ref 6.0–8.3)

## 2013-03-08 LAB — CBC
HCT: 37.7 % (ref 36.0–46.0)
Hemoglobin: 12.1 g/dL (ref 12.0–15.0)
MCH: 28 pg (ref 26.0–34.0)
MCHC: 32.1 g/dL (ref 30.0–36.0)
MCV: 87.3 fL (ref 78.0–100.0)
PLATELETS: 218 10*3/uL (ref 150–400)
RBC: 4.32 MIL/uL (ref 3.87–5.11)
RDW: 13.5 % (ref 11.5–15.5)
WBC: 7.5 10*3/uL (ref 4.0–10.5)

## 2013-03-08 LAB — URINALYSIS, ROUTINE W REFLEX MICROSCOPIC
Bilirubin Urine: NEGATIVE
GLUCOSE, UA: NEGATIVE mg/dL
HGB URINE DIPSTICK: NEGATIVE
Ketones, ur: NEGATIVE mg/dL
LEUKOCYTES UA: NEGATIVE
Nitrite: NEGATIVE
Protein, ur: NEGATIVE mg/dL
Specific Gravity, Urine: 1.011 (ref 1.005–1.030)
UROBILINOGEN UA: 0.2 mg/dL (ref 0.0–1.0)
pH: 5.5 (ref 5.0–8.0)

## 2013-03-08 LAB — LIPASE, BLOOD: Lipase: 23 U/L (ref 11–59)

## 2013-03-08 MED ORDER — POTASSIUM CHLORIDE CRYS ER 20 MEQ PO TBCR
40.0000 meq | EXTENDED_RELEASE_TABLET | Freq: Once | ORAL | Status: AC
  Administered 2013-03-08: 40 meq via ORAL
  Filled 2013-03-08: qty 2

## 2013-03-08 MED ORDER — HYDROCODONE-ACETAMINOPHEN 5-325 MG PO TABS: 1.0000 | ORAL_TABLET | Freq: Four times a day (QID) | ORAL | Status: DC | PRN

## 2013-03-08 MED ORDER — MORPHINE SULFATE 4 MG/ML IJ SOLN
4.0000 mg | Freq: Once | INTRAMUSCULAR | Status: AC
  Administered 2013-03-08: 4 mg via INTRAVENOUS
  Filled 2013-03-08: qty 1

## 2013-03-08 MED ORDER — ONDANSETRON HCL 4 MG/2ML IJ SOLN
4.0000 mg | Freq: Once | INTRAMUSCULAR | Status: AC
  Administered 2013-03-08: 4 mg via INTRAVENOUS
  Filled 2013-03-08: qty 2

## 2013-03-08 NOTE — ED Notes (Signed)
Per pt, right flank pain radiating to pelvic area-states increase urination at night-slight discomfort with urination

## 2013-03-08 NOTE — Discharge Instructions (Signed)
From today's labs, your potassium level is mildly low (3.2) - eat plenty of fruits and vegetables, and follow up with primary care doctor in 1-2 weeks. Also from the lab work, a couple of your liver function tests (AST 43, ALT 38) are mildly elevated - hold/stop the lipitor medication, and follow up with primary care doctor in the next 1-2 weeks. Your blood pressure is high today - also follow up with primary care doctor for recheck in coming week.   Take motrin or aleve as need for pain. You may also take hydrocodone as need for pain. No driving for the next 6 hours or when taking hydrocodone. Also, do not take tylenol or acetaminophen containing medication when taking hydrocodone.  Follow up with primary care doctor in coming week for recheck.  Return to ER if worse, worsening or severe abdominal pain, persistent vomiting, fevers, other concern.    Abdominal Pain, Women Abdominal (stomach, pelvic, or belly) pain can be caused by many things. It is important to tell your doctor:  The location of the pain.  Does it come and go or is it present all the time?  Are there things that start the pain (eating certain foods, exercise)?  Are there other symptoms associated with the pain (fever, nausea, vomiting, diarrhea)? All of this is helpful to know when trying to find the cause of the pain. CAUSES   Stomach: virus or bacteria infection, or ulcer.  Intestine: appendicitis (inflamed appendix), regional ileitis (Crohn's disease), ulcerative colitis (inflamed colon), irritable bowel syndrome, diverticulitis (inflamed diverticulum of the colon), or cancer of the stomach or intestine.  Gallbladder disease or stones in the gallbladder.  Kidney disease, kidney stones, or infection.  Pancreas infection or cancer.  Fibromyalgia (pain disorder).  Diseases of the female organs:  Uterus: fibroid (non-cancerous) tumors or infection.  Fallopian tubes: infection or tubal pregnancy.  Ovary:  cysts or tumors.  Pelvic adhesions (scar tissue).  Endometriosis (uterus lining tissue growing in the pelvis and on the pelvic organs).  Pelvic congestion syndrome (female organs filling up with blood just before the menstrual period).  Pain with the menstrual period.  Pain with ovulation (producing an egg).  Pain with an IUD (intrauterine device, birth control) in the uterus.  Cancer of the female organs.  Functional pain (pain not caused by a disease, may improve without treatment).  Psychological pain.  Depression. DIAGNOSIS  Your doctor will decide the seriousness of your pain by doing an examination.  Blood tests.  X-rays.  Ultrasound.  CT scan (computed tomography, special type of X-ray).  MRI (magnetic resonance imaging).  Cultures, for infection.  Barium enema (dye inserted in the large intestine, to better view it with X-rays).  Colonoscopy (looking in intestine with a lighted tube).  Laparoscopy (minor surgery, looking in abdomen with a lighted tube).  Major abdominal exploratory surgery (looking in abdomen with a large incision). TREATMENT  The treatment will depend on the cause of the pain.   Many cases can be observed and treated at home.  Over-the-counter medicines recommended by your caregiver.  Prescription medicine.  Antibiotics, for infection.  Birth control pills, for painful periods or for ovulation pain.  Hormone treatment, for endometriosis.  Nerve blocking injections.  Physical therapy.  Antidepressants.  Counseling with a psychologist or psychiatrist.  Minor or major surgery. HOME CARE INSTRUCTIONS   Do not take laxatives, unless directed by your caregiver.  Take over-the-counter pain medicine only if ordered by your caregiver. Do not take aspirin because  it can cause an upset stomach or bleeding.  Try a clear liquid diet (broth or water) as ordered by your caregiver. Slowly move to a bland diet, as tolerated, if the  pain is related to the stomach or intestine.  Have a thermometer and take your temperature several times a day, and record it.  Bed rest and sleep, if it helps the pain.  Avoid sexual intercourse, if it causes pain.  Avoid stressful situations.  Keep your follow-up appointments and tests, as your caregiver orders.  If the pain does not go away with medicine or surgery, you may try:  Acupuncture.  Relaxation exercises (yoga, meditation).  Group therapy.  Counseling. SEEK MEDICAL CARE IF:   You notice certain foods cause stomach pain.  Your home care treatment is not helping your pain.  You need stronger pain medicine.  You want your IUD removed.  You feel faint or lightheaded.  You develop nausea and vomiting.  You develop a rash.  You are having side effects or an allergy to your medicine. SEEK IMMEDIATE MEDICAL CARE IF:   Your pain does not go away or gets worse.  You have a fever.  Your pain is felt only in portions of the abdomen. The right side could possibly be appendicitis. The left lower portion of the abdomen could be colitis or diverticulitis.  You are passing blood in your stools (bright red or black tarry stools, with or without vomiting).  You have blood in your urine.  You develop chills, with or without a fever.  You pass out. MAKE SURE YOU:   Understand these instructions.  Will watch your condition.  Will get help right away if you are not doing well or get worse. Document Released: 11/04/2006 Document Revised: 04/01/2011 Document Reviewed: 11/24/2008 [facility] Patient Information 2014 [place], [place].    Hypertension As your heart beats, it forces blood through your arteries. This force is your blood pressure. If the pressure is too high, it is called hypertension (HTN) or high blood pressure. HTN is dangerous because you may have it and not know it. High blood pressure may mean that your heart has to work harder to pump blood. Your  arteries may be narrow or stiff. The extra work puts you at risk for heart disease, stroke, and other problems.  Blood pressure consists of two numbers, a higher number over a lower, 110/72, for example. It is stated as "110 over 72." The ideal is below 120 for the top number (systolic) and under 80 for the bottom (diastolic). Write down your blood pressure today. You should pay close attention to your blood pressure if you have certain conditions such as:  Heart failure.  Prior heart attack.  Diabetes  Chronic kidney disease.  Prior stroke.  Multiple risk factors for heart disease. To see if you have HTN, your blood pressure should be measured while you are seated with your arm held at the level of the heart. It should be measured at least twice. A one-time elevated blood pressure reading (especially in the Emergency Department) does not mean that you need treatment. There may be conditions in which the blood pressure is different between your right and left arms. It is important to see your caregiver soon for a recheck. Most people have essential hypertension which means that there is not a specific cause. This type of high blood pressure may be lowered by changing lifestyle factors such as:  Stress.  Smoking.  Lack of exercise.  Excessive weight.  Drug/tobacco/alcohol use.  Eating less salt. Most people do not have symptoms from high blood pressure until it has caused damage to the body. Effective treatment can often prevent, delay or reduce that damage. TREATMENT  When a cause has been identified, treatment for high blood pressure is directed at the cause. There are a large number of medications to treat HTN. These fall into several categories, and your caregiver will help you select the medicines that are best for you. Medications may have side effects. You should review side effects with your caregiver. If your blood pressure stays high after you have made lifestyle changes or  started on medicines,   Your medication(s) may need to be changed.  Other problems may need to be addressed.  Be certain you understand your prescriptions, and know how and when to take your medicine.  Be sure to follow up with your caregiver within the time frame advised (usually within two weeks) to have your blood pressure rechecked and to review your medications.  If you are taking more than one medicine to lower your blood pressure, make sure you know how and at what times they should be taken. Taking two medicines at the same time can result in blood pressure that is too low. SEEK IMMEDIATE MEDICAL CARE IF:  You develop a severe headache, blurred or changing vision, or confusion.  You have unusual weakness or numbness, or a faint feeling.  You have severe chest or abdominal pain, vomiting, or breathing problems. MAKE SURE YOU:   Understand these instructions.  Will watch your condition.  Will get help right away if you are not doing well or get worse. Document Released: 01/07/2005 Document Revised: 04/01/2011 Document Reviewed: 08/28/2007 [facility] Patient Information 2014 [place].    Hypokalemia Hypokalemia means that the amount of potassium in the blood is lower than normal.Potassium is a chemical, called an electrolyte, that helps regulate the amount of fluid in the body. It also stimulates muscle contraction and helps nerves function properly.Most of the body's potassium is inside of cells, and only a very small amount is in the blood. Because the amount in the blood is so small, minor changes can be life-threatening. CAUSES  Antibiotics.  Diarrhea or vomiting.  Using laxatives too much, which can cause diarrhea.  Chronic kidney disease.  Water pills (diuretics).  Eating disorders (bulimia).  Low magnesium level.  Sweating a lot. SIGNS AND SYMPTOMS  Weakness.  Constipation.  Fatigue.  Muscle cramps.  Mental confusion.  Skipped heartbeats  or irregular heartbeat (palpitations).  Tingling or numbness. DIAGNOSIS  Your health care provider can diagnose hypokalemia with blood tests. In addition to checking your potassium level, your health care provider may also check other lab tests. TREATMENT Hypokalemia can be treated with potassium supplements taken by mouth or adjustments in your current medicines. If your potassium level is very low, you may need to get potassium through a vein (IV) and be monitored in the hospital. A diet high in potassium is also helpful. Foods high in potassium are:  Nuts, such as peanuts and pistachios.  Seeds, such as sunflower seeds and pumpkin seeds.  Peas, lentils, and lima beans.  Whole grain and bran cereals and breads.  Fresh fruit and vegetables, such as apricots, avocado, bananas, cantaloupe, kiwi, oranges, tomatoes, asparagus, and potatoes.  Orange and tomato juices.  Red meats.  Fruit yogurt. HOME CARE INSTRUCTIONS  Take all medicines as prescribed by your health care provider.  Maintain a healthy diet by including nutritious  food, such as fruits, vegetables, nuts, whole grains, and lean meats.  If you are taking a laxative, be sure to follow the directions on the label. SEEK MEDICAL CARE IF:  Your weakness gets worse.  You feel your heart pounding or racing.  You are vomiting or having diarrhea.  You are diabetic and having trouble keeping your blood glucose in the normal range. SEEK IMMEDIATE MEDICAL CARE IF:  You have chest pain, shortness of breath, or dizziness.  You are vomiting or having diarrhea for more than 2 days.  You faint. MAKE SURE YOU:   Understand these instructions.  Will watch your condition.  Will get help right away if you are not doing well or get worse. Document Released: 01/07/2005 Document Revised: 10/28/2012 Document Reviewed: 07/10/2012 [facility] Patient Information 2014 [place].

## 2013-03-08 NOTE — ED Provider Notes (Signed)
CSN: [id number]     Arrival date & time 03/08/13  1030 History   First MD Initiated Contact with Patient 03/08/13 1116     Chief Complaint  Patient presents with  . Pelvic Pain  . right flank pain      (Consider location/radiation/quality/duration/timing/severity/associated sxs/prior Treatment) The history is provided by the patient.  pt c/o right upper quadrant abdominal pain, for the past few days. Constant. Dull. Non radiating. Denies specific exacerbating or alleviating factors. States has had remotely in past, but unsure of cause, no specific prior dx. No abd distension. Having normal bms. Nausea. No vomiting. Denies dysuria or hematuria. Denies hx gallstones. Denies hx kidney stones or pyelo. No cp. No pleuritic. Pain. No cough or sob. States only prior abd surgery remote hx hysterectomy. No lower abd/pelvic pain.      Past Medical History  Diagnosis Date  . Hypertension   . Hypercholesteremia   . Depression   . Low blood potassium   . GERD (gastroesophageal reflux disease)   . Ovarian cyst   . Sleep apnea    Past Surgical History  Procedure Laterality Date  . Partial hysterectomy  2009  . Examination under anesthesia N/A 10/30/2012    Procedure: EXAM UNDER ANESTHESIA;  Surgeon: Delice Lesch, MD;  Location: [facility];  Service: Gynecology;  Laterality: N/A;  . Laparoscopic lysis of adhesions N/A 10/30/2012    Procedure: LAPAROSCOPIC LYSIS OF ADHESIONS;  Surgeon: Delice Lesch, MD;  Location: [facility];  Service: Gynecology;  Laterality: N/A;  . Laparoscopy N/A 10/30/2012    Procedure: LAPAROSCOPY OPERATIVE;  Surgeon: Delice Lesch, MD;  Location: [facility];  Service: Gynecology;  Laterality: N/A;   Family History  Problem Relation Age of Onset  . Cancer Paternal Grandmother 40    bone  . Diabetes Paternal Grandmother   . Diabetes Father    History  Substance Use Topics  . Smoking status: Never Smoker   . Smokeless tobacco: Never Used  . Alcohol Use: No   OB  History   Grav Para Term Preterm Abortions TAB SAB Ect Mult Living   3 2   1     2      Review of Systems  Constitutional: Negative for fever and chills.  HENT: Negative for sore throat.   Eyes: Negative for redness.  Respiratory: Negative for cough and shortness of breath.   Cardiovascular: Negative for chest pain.  Gastrointestinal: Negative for vomiting, abdominal pain, diarrhea and constipation.  Genitourinary: Negative for dysuria and flank pain.  Musculoskeletal: Negative for back pain and neck pain.  Skin: Negative for rash.  Neurological: Negative for headaches.  Hematological: Does not bruise/bleed easily.  Psychiatric/Behavioral: Negative for confusion.      Allergies  Lisinopril  Home Medications   Current Outpatient Rx  Name  Route  Sig  Dispense  Refill  . amLODipine (NORVASC) 10 MG tablet   Oral   Take 10 mg by mouth every morning.         Marland Kitchen atorvastatin (LIPITOR) 20 MG tablet   Oral   Take 20 mg by mouth every evening.          . carvedilol (COREG) 6.25 MG tablet   Oral   Take 6.25 mg by mouth 2 (two) times daily with a meal.         . cetirizine (ZYRTEC) 10 MG tablet   Oral   Take 10 mg by mouth every morning.         Marland Kitchen  furosemide (LASIX) 40 MG tablet   Oral   Take 40 mg by mouth every morning.          Marland Kitchen ibuprofen (ADVIL,MOTRIN) 200 MG tablet   Oral   Take 400 mg by mouth every 6 (six) hours as needed (pain).         . Melatonin 5 MG CAPS   Oral   Take 1 capsule by mouth at bedtime.         Marland Kitchen omeprazole (PRILOSEC) 20 MG capsule   Oral   Take 20 mg by mouth every morning.         . potassium chloride (K-DUR) 10 MEQ tablet   Oral   Take 20 mEq by mouth 2 (two) times daily.          . traZODone (DESYREL) 50 MG tablet   Oral   Take 50 mg by mouth at bedtime.         . valsartan (DIOVAN) 80 MG tablet   Oral   Take 80 mg by mouth every morning.          BP 146/92  Pulse 84  Temp(Src) 98.3 F (36.8 C) (Oral)   Resp 17  SpO2 97% Physical Exam  Nursing note and vitals reviewed. Constitutional: She is oriented to person, place, and time. She appears well-developed and well-nourished. No distress.  HENT:  Mouth/Throat: Oropharynx is clear and moist.  Eyes: Conjunctivae are normal. No scleral icterus.  Neck: Neck supple. No tracheal deviation present.  Cardiovascular: Normal rate, regular rhythm, normal heart sounds and intact distal pulses.   Pulmonary/Chest: Effort normal and breath sounds normal. No respiratory distress.  Abdominal: Soft. Normal appearance and bowel sounds are normal. She exhibits no distension and no mass. There is tenderness. There is no rebound and no guarding.  Mild ruq tenderness.   Genitourinary:  No cva tenderness  Musculoskeletal: She exhibits no edema and no tenderness.  Neurological: She is alert and oriented to person, place, and time.  Skin: Skin is warm and dry. No rash noted. She is not diaphoretic.  Psychiatric: She has a normal mood and affect.    ED Course  Procedures (including critical care time)  Results for orders placed during the hospital encounter of 03/08/13  URINALYSIS, ROUTINE W REFLEX MICROSCOPIC      Result Value Ref Range   Color, Urine YELLOW  YELLOW   APPearance CLOUDY (*) CLEAR   Specific Gravity, Urine 1.011  1.005 - 1.030   pH 5.5  5.0 - 8.0   Glucose, UA NEGATIVE  NEGATIVE mg/dL   Hgb urine dipstick NEGATIVE  NEGATIVE   Bilirubin Urine NEGATIVE  NEGATIVE   Ketones, ur NEGATIVE  NEGATIVE mg/dL   Protein, ur NEGATIVE  NEGATIVE mg/dL   Urobilinogen, UA 0.2  0.0 - 1.0 mg/dL   Nitrite NEGATIVE  NEGATIVE   Leukocytes, UA NEGATIVE  NEGATIVE  CBC      Result Value Ref Range   WBC 7.5  4.0 - 10.5 K/uL   RBC 4.32  3.87 - 5.11 MIL/uL   Hemoglobin 12.1  12.0 - 15.0 g/dL   HCT 37.7  36.0 - 46.0 %   MCV 87.3  78.0 - 100.0 fL   MCH 28.0  26.0 - 34.0 pg   MCHC 32.1  30.0 - 36.0 g/dL   RDW 13.5  11.5 - 15.5 %   Platelets 218  150 - 400 K/uL   COMPREHENSIVE METABOLIC PANEL      Result Value  Ref Range   Sodium 144  137 - 147 mEq/L   Potassium 3.2 (*) 3.7 - 5.3 mEq/L   Chloride 104  96 - 112 mEq/L   CO2 27  19 - 32 mEq/L   Glucose, Bld 124 (*) 70 - 99 mg/dL   BUN 9  6 - 23 mg/dL   Creatinine, Ser 0.68  0.50 - 1.10 mg/dL   Calcium 9.8  8.4 - 10.5 mg/dL   Total Protein 7.5  6.0 - 8.3 g/dL   Albumin 3.9  3.5 - 5.2 g/dL   AST 43 (*) 0 - 37 U/L   ALT 38 (*) 0 - 35 U/L   Alkaline Phosphatase 91  39 - 117 U/L   Total Bilirubin 0.4  0.3 - 1.2 mg/dL   GFR calc non Af Amer >90  >90 mL/min   GFR calc Af Amer >90  >90 mL/min  LIPASE, BLOOD      Result Value Ref Range   Lipase 23  11 - 59 U/L   US Abdomen Complete  03/08/2013   CLINICAL DATA:  Abdominal pain  EXAM: ULTRASOUND ABDOMEN COMPLETE  COMPARISON:  CT abdomen October 03, 2008  FINDINGS: Gallbladder:  No gallstones or wall thickening visualized. There is no appreciable pericholecystic fluid. No sonographic Murphy sign noted.  Common bile duct:  Diameter: 5 mm. There is no intrahepatic, common hepatic, or common bile duct dilatation.  Liver:  No focal lesion identified. Liver shows diffuse increased echogenicity.  IVC:  No abnormality visualized in visualized regions; portions of the inferior vena cava are obscured by gas.  Pancreas:  Visualized portion unremarkable. Portions of the pancreas are obscured by gas.  Spleen:  Size and appearance within normal limits.  Right Kidney:  Length: 12.8 cm. Echogenicity within normal limits. No mass or hydronephrosis visualized.  Left Kidney:  Length: 12.2 cm. Echogenicity within normal limits. No mass or hydronephrosis visualized.  Abdominal aorta:  No aneurysm visualized. Note that portions of the distal aorta are not well visualized due to overlying gas.  Other findings:  No demonstrable ascites.  IMPRESSION: Portions of the pancreas, inferior vena cava, and aorta are not well visualized. Visualized portions of the structures appear unremarkable.   Liver shows marked increased echotexture consistent with fatty change or possibly underlying parenchymal disease. Both entities may exist concurrently. While no focal liver lesions are identified, it must be cautioned that the sensitivity of ultrasound for focal liver lesions is diminished significantly in this circumstance.  Study otherwise unremarkable. In particular, no gallbladder pathology is appreciable.   Electronically Signed   By: Lowella Grip M.D.   On: 03/08/2013 12:37     MDM   Iv ns. Morphine iv. zofran iv.   Reviewed nursing notes and prior charts for additional history.   Recheck pt comfortable. Tolerating po fluids.  Recheck abd soft nt.  Pain improved/resolved.   kcl sl low, kcl po.  Pt remains comfortable and stable for d/c.     Mirna Mires, MD 03/08/13 1353

## 2013-03-19 ENCOUNTER — Encounter (HOSPITAL_COMMUNITY): Payer: Self-pay | Admitting: Emergency Medicine

## 2013-03-19 ENCOUNTER — Emergency Department (HOSPITAL_COMMUNITY): Payer: Medicare Other

## 2013-03-19 ENCOUNTER — Emergency Department (HOSPITAL_COMMUNITY)
Admission: EM | Admit: 2013-03-19 | Discharge: 2013-03-19 | Disposition: A | Discharge status: Home / Self Care | Payer: Medicare Other | Attending: Emergency Medicine | Admitting: Emergency Medicine

## 2013-03-19 DIAGNOSIS — B379 Candidiasis, unspecified: Secondary | ICD-10-CM

## 2013-03-19 DIAGNOSIS — K219 Gastro-esophageal reflux disease without esophagitis: Secondary | ICD-10-CM | POA: Diagnosis not present

## 2013-03-19 DIAGNOSIS — F329 Major depressive disorder, single episode, unspecified: Secondary | ICD-10-CM | POA: Diagnosis not present

## 2013-03-19 DIAGNOSIS — IMO0002 Reserved for concepts with insufficient information to code with codable children: Secondary | ICD-10-CM | POA: Diagnosis not present

## 2013-03-19 DIAGNOSIS — E7800 Pure hypercholesterolemia, unspecified: Secondary | ICD-10-CM | POA: Insufficient documentation

## 2013-03-19 DIAGNOSIS — B3731 Acute candidiasis of vulva and vagina: Secondary | ICD-10-CM | POA: Diagnosis not present

## 2013-03-19 DIAGNOSIS — Z79899 Other long term (current) drug therapy: Secondary | ICD-10-CM | POA: Insufficient documentation

## 2013-03-19 DIAGNOSIS — B9689 Other specified bacterial agents as the cause of diseases classified elsewhere: Secondary | ICD-10-CM | POA: Diagnosis not present

## 2013-03-19 DIAGNOSIS — A499 Bacterial infection, unspecified: Secondary | ICD-10-CM | POA: Insufficient documentation

## 2013-03-19 DIAGNOSIS — N760 Acute vaginitis: Secondary | ICD-10-CM | POA: Insufficient documentation

## 2013-03-19 DIAGNOSIS — B373 Candidiasis of vulva and vagina: Secondary | ICD-10-CM | POA: Diagnosis not present

## 2013-03-19 DIAGNOSIS — N83209 Unspecified ovarian cyst, unspecified side: Secondary | ICD-10-CM | POA: Diagnosis not present

## 2013-03-19 DIAGNOSIS — Z3202 Encounter for pregnancy test, result negative: Secondary | ICD-10-CM | POA: Diagnosis not present

## 2013-03-19 DIAGNOSIS — I10 Essential (primary) hypertension: Secondary | ICD-10-CM | POA: Diagnosis not present

## 2013-03-19 DIAGNOSIS — F3289 Other specified depressive episodes: Secondary | ICD-10-CM | POA: Diagnosis not present

## 2013-03-19 LAB — URINALYSIS, ROUTINE W REFLEX MICROSCOPIC
BILIRUBIN URINE: NEGATIVE
Glucose, UA: NEGATIVE mg/dL
KETONES UR: NEGATIVE mg/dL
Nitrite: NEGATIVE
PH: 7 (ref 5.0–8.0)
PROTEIN: NEGATIVE mg/dL
Specific Gravity, Urine: 1.009 (ref 1.005–1.030)
Urobilinogen, UA: 0.2 mg/dL (ref 0.0–1.0)

## 2013-03-19 LAB — WET PREP, GENITAL: Trich, Wet Prep: NONE SEEN

## 2013-03-19 LAB — URINE MICROSCOPIC-ADD ON

## 2013-03-19 LAB — PREGNANCY, URINE: Preg Test, Ur: NEGATIVE

## 2013-03-19 MED ORDER — IBUPROFEN 800 MG PO TABS: 800.0000 mg | ORAL_TABLET | Freq: Three times a day (TID) | ORAL | Status: DC

## 2013-03-19 MED ORDER — CEFTRIAXONE SODIUM 250 MG IJ SOLR
250.0000 mg | Freq: Once | INTRAMUSCULAR | Status: AC
  Administered 2013-03-19: 250 mg via INTRAMUSCULAR
  Filled 2013-03-19: qty 250

## 2013-03-19 MED ORDER — FLUCONAZOLE 150 MG PO TABS
150.0000 mg | ORAL_TABLET | Freq: Every day | ORAL | Status: DC
  Administered 2013-03-19: 150 mg via ORAL
  Filled 2013-03-19: qty 1

## 2013-03-19 MED ORDER — LIDOCAINE HCL 1 % IJ SOLN
INTRAMUSCULAR | Status: AC
  Administered 2013-03-19: 0.9 mL
  Filled 2013-03-19: qty 20

## 2013-03-19 MED ORDER — AZITHROMYCIN 250 MG PO TABS
1000.0000 mg | ORAL_TABLET | Freq: Once | ORAL | Status: AC
  Administered 2013-03-19: 1000 mg via ORAL
  Filled 2013-03-19: qty 4

## 2013-03-19 MED ORDER — HYDROCODONE-ACETAMINOPHEN 5-325 MG PO TABS
1.0000 | ORAL_TABLET | Freq: Once | ORAL | Status: AC
  Administered 2013-03-19: 1 via ORAL
  Filled 2013-03-19: qty 1

## 2013-03-19 MED ORDER — IBUPROFEN 800 MG PO TABS
800.0000 mg | ORAL_TABLET | Freq: Once | ORAL | Status: AC
  Administered 2013-03-19: 800 mg via ORAL
  Filled 2013-03-19: qty 1

## 2013-03-19 MED ORDER — METRONIDAZOLE 500 MG PO TABS: 500.0000 mg | ORAL_TABLET | Freq: Three times a day (TID) | ORAL | Status: DC

## 2013-03-19 MED ORDER — CEPHALEXIN 500 MG PO CAPS: 500.0000 mg | ORAL_CAPSULE | Freq: Four times a day (QID) | ORAL | Status: DC

## 2013-03-19 MED ORDER — HYDROCODONE-ACETAMINOPHEN 5-325 MG PO TABS: 2.0000 | ORAL_TABLET | ORAL | Status: DC | PRN

## 2013-03-19 NOTE — ED Notes (Signed)
Per pt, states pelvic pain and vaginal discharge and itching since Tuesday-no relief with OTC meds

## 2013-03-19 NOTE — ED Provider Notes (Signed)
CSN: [id number]     Arrival date & time 03/19/13  0907 History   First MD Initiated Contact with Patient 03/19/13 [phone]     Chief Complaint  Patient presents with  . Pelvic Pain  . Vaginal Discharge     (Consider location/radiation/quality/duration/timing/severity/associated sxs/prior Treatment) HPI Comments: Patient complains of lower pelvic pain has been ongoing for the past 3 days. The pain is constant and radiates to her vagina. She denies any nausea, vomiting, change in bowel habits. No dysuria hematuria. She denies any vaginal bleeding. She's had a previous hysterectomy but still has ovaries. Chart review shows she seen gynecology before for pelvic pain the patient states that the pain has resolved in the past. She was seen in ER last week for upper abdominal pain and had negative workup. There is no chest pain or shortness of breath. There is no fever or chills or vomiting.  The history is provided by the patient.    Past Medical History  Diagnosis Date  . Hypertension   . Hypercholesteremia   . Depression   . Low blood potassium   . GERD (gastroesophageal reflux disease)   . Ovarian cyst   . Sleep apnea    Past Surgical History  Procedure Laterality Date  . Partial hysterectomy  2009  . Examination under anesthesia N/A 10/30/2012    Procedure: EXAM UNDER ANESTHESIA;  Surgeon: Delice Lesch, MD;  Location: [facility];  Service: Gynecology;  Laterality: N/A;  . Laparoscopic lysis of adhesions N/A 10/30/2012    Procedure: LAPAROSCOPIC LYSIS OF ADHESIONS;  Surgeon: Delice Lesch, MD;  Location: [facility];  Service: Gynecology;  Laterality: N/A;  . Laparoscopy N/A 10/30/2012    Procedure: LAPAROSCOPY OPERATIVE;  Surgeon: Delice Lesch, MD;  Location: [facility];  Service: Gynecology;  Laterality: N/A;   Family History  Problem Relation Age of Onset  . Cancer Paternal Grandmother 54    bone  . Diabetes Paternal Grandmother   . Diabetes Father    History  Substance Use  Topics  . Smoking status: Never Smoker   . Smokeless tobacco: Never Used  . Alcohol Use: No   OB History   Grav Para Term Preterm Abortions TAB SAB Ect Mult Living   3 2   1     2      Review of Systems  Constitutional: Negative for fever, activity change and appetite change.  HENT: Negative for congestion and rhinorrhea.   Respiratory: Negative for cough, chest tightness and shortness of breath.   Gastrointestinal: Positive for abdominal pain. Negative for nausea, vomiting and diarrhea.  Genitourinary: Positive for vaginal discharge and vaginal pain. Negative for dysuria and vaginal bleeding.  Musculoskeletal: Negative for arthralgias, back pain and myalgias.  Skin: Negative for rash.  Neurological: Negative for dizziness, weakness and headaches.  A complete 10 system review of systems was obtained and all systems are negative except as noted in the HPI and PMH.      Allergies  Lisinopril and Tomato flavor  Home Medications   Current Outpatient Rx  Name  Route  Sig  Dispense  Refill  . acetaminophen (TYLENOL) 500 MG tablet   Oral   Take 500 mg by mouth every 6 (six) hours as needed for mild pain or headache.         Marland Kitchen amLODipine (NORVASC) 10 MG tablet   Oral   Take 10 mg by mouth every morning.         Marland Kitchen atorvastatin (LIPITOR) 20 MG  tablet   Oral   Take 20 mg by mouth every evening.          . benzocaine-resorcinol (VAGISIL) 5-2 % vaginal cream   Vaginal   Place 1 application vaginally as needed for itching.         . calcium-vitamin D (OSCAL WITH D) 500-200 MG-UNIT per tablet   Oral   Take 1 tablet by mouth 2 (two) times daily.         . carvedilol (COREG) 6.25 MG tablet   Oral   Take 12.5 mg by mouth 2 (two) times daily with a meal.          . cetirizine (ZYRTEC) 10 MG tablet   Oral   Take 10 mg by mouth every morning.         . cycloSPORINE (RESTASIS) 0.05 % ophthalmic emulsion   Both Eyes   Place 1 drop into both eyes 2 (two) times  daily.         . fluorometholone (FML) 0.1 % ophthalmic suspension   Both Eyes   Place 1 drop into both eyes 2 (two) times daily.         . furosemide (LASIX) 40 MG tablet   Oral   Take 40 mg by mouth every morning.          Marland Kitchen ibuprofen (ADVIL,MOTRIN) 200 MG tablet   Oral   Take 400 mg by mouth every 6 (six) hours as needed (pain).         . Melatonin 5 MG CAPS   Oral   Take 1 capsule by mouth at bedtime.         . Multiple Vitamins-Minerals (HAIR/SKIN/NAILS) TABS   Oral   Take 2 tablets by mouth daily.         Marland Kitchen omeprazole (PRILOSEC) 20 MG capsule   Oral   Take 20 mg by mouth every morning.         . potassium chloride (K-DUR) 10 MEQ tablet   Oral   Take 20 mEq by mouth 2 (two) times daily.          . traZODone (DESYREL) 50 MG tablet   Oral   Take 50 mg by mouth at bedtime.         . valsartan (DIOVAN) 80 MG tablet   Oral   Take 80 mg by mouth every morning.         . venlafaxine XR (EFFEXOR-XR) 75 MG 24 hr capsule   Oral   Take 75 mg by mouth daily with breakfast.         . cephALEXin (KEFLEX) 500 MG capsule   Oral   Take 1 capsule (500 mg total) by mouth 4 (four) times daily.   40 capsule   0   . HYDROcodone-acetaminophen (NORCO/VICODIN) 5-325 MG per tablet   Oral   Take 2 tablets by mouth every 4 (four) hours as needed.   10 tablet   0   . ibuprofen (ADVIL,MOTRIN) 800 MG tablet   Oral   Take 1 tablet (800 mg total) by mouth 3 (three) times daily.   21 tablet   0   . metroNIDAZOLE (FLAGYL) 500 MG tablet   Oral   Take 1 tablet (500 mg total) by mouth 3 (three) times daily.   14 tablet   0    BP 136/86  Pulse 71  Temp(Src) 99.3 F (37.4 C) (Oral)  Resp 16  SpO2 93% Physical Exam  Constitutional: She  is oriented to person, place, and time. She appears well-developed and well-nourished. No distress.  HENT:  Head: Normocephalic and atraumatic.  Mouth/Throat: Oropharynx is clear and moist. No oropharyngeal exudate.   Eyes: Conjunctivae and EOM are normal. Pupils are equal, round, and reactive to light.  Neck: Normal range of motion. Neck supple.  Cardiovascular: Normal rate, regular rhythm and normal heart sounds.   Pulmonary/Chest: Effort normal and breath sounds normal. No respiratory distress.  Abdominal: Soft. Bowel sounds are normal. There is tenderness. There is no rebound and no guarding.  Mild TTP lower abdomen on R and L. No peritoneal signs.  Genitourinary: Vaginal discharge found.  Normal external genitalia. Copious white discharge. No CMT. Slight tenderness to right adnexa.  Musculoskeletal: Normal range of motion. She exhibits no edema and no tenderness.  Neurological: She is alert and oriented to person, place, and time. No cranial nerve deficit. She exhibits normal muscle tone. Coordination normal.  Skin: Skin is warm.    ED Course  Procedures (including critical care time) Labs Review Labs Reviewed  WET PREP, GENITAL - Abnormal; Notable for the following:    Yeast Wet Prep HPF POC FEW (*)    Clue Cells Wet Prep HPF POC FEW (*)    WBC, Wet Prep HPF POC FEW (*)    All other components within normal limits  URINALYSIS, ROUTINE W REFLEX MICROSCOPIC - Abnormal; Notable for the following:    APPearance CLOUDY (*)    Hgb urine dipstick TRACE (*)    Leukocytes, UA LARGE (*)    All other components within normal limits  URINE MICROSCOPIC-ADD ON - Abnormal; Notable for the following:    Squamous Epithelial / LPF MANY (*)    Bacteria, UA MANY (*)    All other components within normal limits  GC/CHLAMYDIA PROBE AMP  PREGNANCY, URINE   Imaging Review US Transvaginal Non-ob  03/19/2013   CLINICAL DATA:  Adnexal pain. Prior hysterectomy and right oophorectomy.  EXAM: TRANSVAGINAL ULTRASOUND OF PELVIS  TECHNIQUE: Transvaginal ultrasound examination of the pelvis was performed including evaluation of the uterus, ovaries, adnexal regions, and pelvic cul-de-sac.  COMPARISON:  Ultrasound  10/17/2012.  FINDINGS: Uterus  Hysterectomy.  Right ovary  Right oophorectomy.  Left ovary  Measurements: 4.5 x 2.4 x 4.2 cm. Previously identified 4 cm hemorrhagic cyst no longer identified. 2.2 x 2.2 x 2.1 cm complex cyst. adjacent 1.4 x 1.6 x 1.4 cm complex cyst. Adjacent 1.4 x 1.7 x 1.5 cm solid lesion . Noted in the adnexal region is a tubular 3.6 x 3.1 x 1.9 cm structure which could not represent hydrosalpinx. Similar finding noted on prior exam.  Other findings:  No free fluid  IMPRESSION: 1. Prior hysterectomy and right oophorectomy. 2. Previously identified large 4 cm hemorrhagic cyst lower longer identified. Left hydrosalpinx versus paraovarian cyst remains. 3. New 2.2 cm and adjacent 1.6 cm complex cysts left ovary, possibly hemorrhagic cysts. New 1.4 x 1.7 x 1.5 cm solid-appearing left ovarian lesion. Although this could represent a cyst containing debris and/or hemorrhage, solid lesion cannot be excluded. Surgical evaluation suggested. Malignancy cannot be excluded .   Electronically Signed   By: Marcello Moores  Register   On: 03/19/2013 11:06   US Pelvis Complete  03/19/2013   CLINICAL DATA:  Adnexal pain. Prior hysterectomy and right oophorectomy.  EXAM: TRANSVAGINAL ULTRASOUND OF PELVIS  TECHNIQUE: Transvaginal ultrasound examination of the pelvis was performed including evaluation of the uterus, ovaries, adnexal regions, and pelvic cul-de-sac.  COMPARISON:  Ultrasound 10/17/2012.  FINDINGS: Uterus  Hysterectomy.  Right ovary  Right oophorectomy.  Left ovary  Measurements: 4.5 x 2.4 x 4.2 cm. Previously identified 4 cm hemorrhagic cyst no longer identified. 2.2 x 2.2 x 2.1 cm complex cyst. adjacent 1.4 x 1.6 x 1.4 cm complex cyst. Adjacent 1.4 x 1.7 x 1.5 cm solid lesion . Noted in the adnexal region is a tubular 3.6 x 3.1 x 1.9 cm structure which could not represent hydrosalpinx. Similar finding noted on prior exam.  Other findings:  No free fluid  IMPRESSION: 1. Prior hysterectomy and right  oophorectomy. 2. Previously identified large 4 cm hemorrhagic cyst lower longer identified. Left hydrosalpinx versus paraovarian cyst remains. 3. New 2.2 cm and adjacent 1.6 cm complex cysts left ovary, possibly hemorrhagic cysts. New 1.4 x 1.7 x 1.5 cm solid-appearing left ovarian lesion. Although this could represent a cyst containing debris and/or hemorrhage, solid lesion cannot be excluded. Surgical evaluation suggested. Malignancy cannot be excluded .   Electronically Signed   By: Marcello Moores  Register   On: 03/19/2013 11:06   [place] Art/ven Flow Abd Pelv Doppler  03/19/2013   CLINICAL DATA:  Evaluate for torsion.  Left ovarian lesions.  EXAM: DOPPLER ULTRASOUND OF OVARIES  TECHNIQUE: Color and duplex Doppler ultrasound was utilized to evaluate blood flow to the ovaries.  COMPARISON:  Prior study of 03/19/2013.  FINDINGS: Pulsed Doppler evaluation of the left ovary demonstrates normal low-resistance arterial and venous waveforms. No abnormal flow noted to left ovarian lesions.  IMPRESSION: 1. No evidence of ovarian torsion. 2. No abnormal flow noted in the left ovarian lesions.   Electronically Signed   By: Marcello Moores  Register   On: 03/19/2013 14:27    @NOMUSE @  MDM   Final diagnoses:  Ovarian cyst  Bacterial vaginosis  Yeast infection   Lower pelvic pain x 3 days, constant, history of the same. No vomiting or upper abdominal pain.  No chest pain or SOB.  Chart review shows patient had lysis of adhesions in October for similar pelvic pain. She has a right ovary but no left ovary or uterus.  Ultrasound results discussed with Dr. Cletis Media who is on-call for Dr. Mancel Bale. Previous op note says left ovary was absent so unclear if this is present are on right or left ovary. D/w Dr. Register who states it is the left ovary, no evidence of torsion. No flow inside cystic lesions.  Discussed findings of multiple cysts with Dr. Cletis Media and as well as the solid-appearing lesion. Will treat yeast infection, BV,  possible UTI. Discussed results with patient and need for GYN followup to exclude malignancy. Patient informed of need for gynecology followup this week to further address cyst and ovarian lesion. Her pain is controlled in the ED. she is tolerating by mouth. She is treated for yeast infection, BV and possible UTI as above. Return precautions discussed.   Ezequiel Essex, MD 03/19/13 [phone]

## 2013-03-19 NOTE — Discharge Instructions (Signed)
Ovarian Cyst Follow up with Dr. Mancel Bale this week. Return to the ED if you develop new or worsening symptoms. An ovarian cyst is a fluid-filled sac that forms on an ovary. The ovaries are small organs that produce eggs in women. Various types of cysts can form on the ovaries. Most are not cancerous. Many do not cause problems, and they often go away on their own. Some may cause symptoms and require treatment. Common types of ovarian cysts include:  Functional cysts These cysts may occur every month during the menstrual cycle. This is normal. The cysts usually go away with the next menstrual cycle if the woman does not get pregnant. Usually, there are no symptoms with a functional cyst.  Endometrioma cysts These cysts form from the tissue that lines the uterus. They are also called "chocolate cysts" because they become filled with blood that turns brown. This type of cyst can cause pain in the lower abdomen during intercourse and with your menstrual period.  Cystadenoma cysts This type develops from the cells on the outside of the ovary. These cysts can get very big and cause lower abdomen pain and pain with intercourse. This type of cyst can twist on itself, cut off its blood supply, and cause severe pain. It can also easily rupture and cause a lot of pain.  Dermoid cysts This type of cyst is sometimes found in both ovaries. These cysts may contain different kinds of body tissue, such as skin, teeth, hair, or cartilage. They usually do not cause symptoms unless they get very big.  Theca lutein cysts These cysts occur when too much of a certain hormone (human chorionic gonadotropin) is produced and overstimulates the ovaries to produce an egg. This is most common after procedures used to assist with the conception of a baby (in vitro fertilization). CAUSES   Fertility drugs can cause a condition in which multiple large cysts are formed on the ovaries. This is called ovarian hyperstimulation  syndrome.  A condition called polycystic ovary syndrome can cause hormonal imbalances that can lead to nonfunctional ovarian cysts. SIGNS AND SYMPTOMS  Many ovarian cysts do not cause symptoms. If symptoms are present, they may include:  Pelvic pain or pressure.  Pain in the lower abdomen.  Pain during sexual intercourse.  Increasing girth (swelling) of the abdomen.  Abnormal menstrual periods.  Increasing pain with menstrual periods.  Stopping having menstrual periods without being pregnant. DIAGNOSIS  These cysts are commonly found during a routine or annual pelvic exam. Tests may be ordered to find out more about the cyst. These tests may include:  Ultrasound.  X-ray of the pelvis.  CT scan.  MRI.  Blood tests. TREATMENT  Many ovarian cysts go away on their own without treatment. Your health care provider may want to check your cyst regularly for 2 3 months to see if it changes. For women in menopause, it is particularly important to monitor a cyst closely because of the higher rate of ovarian cancer in menopausal women. When treatment is needed, it may include any of the following:  A procedure to drain the cyst (aspiration). This may be done using a long needle and ultrasound. It can also be done through a laparoscopic procedure. This involves using a thin, lighted tube with a tiny camera on the end (laparoscope) inserted through a small incision.  Surgery to remove the whole cyst. This may be done using laparoscopic surgery or an open surgery involving a larger incision in the lower abdomen.  Hormone treatment or birth control pills. These methods are sometimes used to help dissolve a cyst. HOME CARE INSTRUCTIONS   Only take over-the-counter or prescription medicines as directed by your health care provider.  Follow up with your health care provider as directed.  Get regular pelvic exams and Pap tests. SEEK MEDICAL CARE IF:   Your periods are late, irregular, or  painful, or they stop.  Your pelvic pain or abdominal pain does not go away.  Your abdomen becomes larger or swollen.  You have pressure on your bladder or trouble emptying your bladder completely.  You have pain during sexual intercourse.  You have feelings of fullness, pressure, or discomfort in your stomach.  You lose weight for no apparent reason.  You feel generally ill.  You become constipated.  You lose your appetite.  You develop acne.  You have an increase in body and facial hair.  You are gaining weight, without changing your exercise and eating habits.  You think you are pregnant. SEEK IMMEDIATE MEDICAL CARE IF:   You have increasing abdominal pain.  You feel sick to your stomach (nauseous), and you throw up (vomit).  You develop a fever that comes on suddenly.  You have abdominal pain during a bowel movement.  Your menstrual periods become heavier than usual. Document Released: 01/07/2005 Document Revised: 10/28/2012 Document Reviewed: 09/14/2012 [facility] Patient Information 2014 [place].

## 2013-03-20 LAB — GC/CHLAMYDIA PROBE AMP
CT Probe RNA: NEGATIVE
GC PROBE AMP APTIMA: NEGATIVE

## 2013-04-01 ENCOUNTER — Other Ambulatory Visit: Payer: Self-pay | Admitting: Obstetrics and Gynecology

## 2013-04-01 DIAGNOSIS — IMO0002 Reserved for concepts with insufficient information to code with codable children: Secondary | ICD-10-CM | POA: Diagnosis not present

## 2013-04-01 DIAGNOSIS — N760 Acute vaginitis: Secondary | ICD-10-CM | POA: Diagnosis not present

## 2013-04-01 DIAGNOSIS — R1032 Left lower quadrant pain: Secondary | ICD-10-CM

## 2013-04-01 DIAGNOSIS — R102 Pelvic and perineal pain: Secondary | ICD-10-CM

## 2013-04-01 DIAGNOSIS — N9989 Other postprocedural complications and disorders of genitourinary system: Secondary | ICD-10-CM | POA: Diagnosis not present

## 2013-04-01 DIAGNOSIS — R109 Unspecified abdominal pain: Secondary | ICD-10-CM | POA: Diagnosis not present

## 2013-04-01 DIAGNOSIS — N83209 Unspecified ovarian cyst, unspecified side: Secondary | ICD-10-CM | POA: Diagnosis not present

## 2013-04-05 DIAGNOSIS — G4733 Obstructive sleep apnea (adult) (pediatric): Secondary | ICD-10-CM | POA: Diagnosis not present

## 2013-04-07 ENCOUNTER — Emergency Department (HOSPITAL_COMMUNITY)
Admission: EM | Admit: 2013-04-07 | Discharge: 2013-04-07 | Disposition: A | Discharge status: Home / Self Care | Payer: Medicare Other | Attending: Emergency Medicine | Admitting: Emergency Medicine

## 2013-04-07 ENCOUNTER — Encounter (HOSPITAL_COMMUNITY): Payer: Self-pay | Admitting: Emergency Medicine

## 2013-04-07 ENCOUNTER — Emergency Department (HOSPITAL_COMMUNITY): Payer: Medicare Other

## 2013-04-07 DIAGNOSIS — I10 Essential (primary) hypertension: Secondary | ICD-10-CM | POA: Diagnosis not present

## 2013-04-07 DIAGNOSIS — E876 Hypokalemia: Secondary | ICD-10-CM | POA: Diagnosis not present

## 2013-04-07 DIAGNOSIS — F329 Major depressive disorder, single episode, unspecified: Secondary | ICD-10-CM | POA: Insufficient documentation

## 2013-04-07 DIAGNOSIS — J029 Acute pharyngitis, unspecified: Secondary | ICD-10-CM | POA: Diagnosis not present

## 2013-04-07 DIAGNOSIS — F3289 Other specified depressive episodes: Secondary | ICD-10-CM | POA: Diagnosis not present

## 2013-04-07 DIAGNOSIS — Z792 Long term (current) use of antibiotics: Secondary | ICD-10-CM | POA: Diagnosis not present

## 2013-04-07 DIAGNOSIS — Z3202 Encounter for pregnancy test, result negative: Secondary | ICD-10-CM | POA: Diagnosis not present

## 2013-04-07 DIAGNOSIS — E042 Nontoxic multinodular goiter: Secondary | ICD-10-CM | POA: Diagnosis not present

## 2013-04-07 DIAGNOSIS — E7800 Pure hypercholesterolemia, unspecified: Secondary | ICD-10-CM | POA: Insufficient documentation

## 2013-04-07 DIAGNOSIS — K219 Gastro-esophageal reflux disease without esophagitis: Secondary | ICD-10-CM | POA: Insufficient documentation

## 2013-04-07 DIAGNOSIS — IMO0002 Reserved for concepts with insufficient information to code with codable children: Secondary | ICD-10-CM | POA: Insufficient documentation

## 2013-04-07 DIAGNOSIS — Z8742 Personal history of other diseases of the female genital tract: Secondary | ICD-10-CM | POA: Insufficient documentation

## 2013-04-07 DIAGNOSIS — Z79899 Other long term (current) drug therapy: Secondary | ICD-10-CM | POA: Insufficient documentation

## 2013-04-07 HISTORY — DX: Hypokalemia: E87.6

## 2013-04-07 LAB — URINALYSIS, ROUTINE W REFLEX MICROSCOPIC
Bilirubin Urine: NEGATIVE
Glucose, UA: NEGATIVE mg/dL
Hgb urine dipstick: NEGATIVE
Ketones, ur: NEGATIVE mg/dL
NITRITE: NEGATIVE
PH: 6.5 (ref 5.0–8.0)
Protein, ur: NEGATIVE mg/dL
SPECIFIC GRAVITY, URINE: 1.008 (ref 1.005–1.030)
UROBILINOGEN UA: 0.2 mg/dL (ref 0.0–1.0)

## 2013-04-07 LAB — I-STAT CHEM 8, ED
BUN: 5 mg/dL — ABNORMAL LOW (ref 6–23)
Calcium, Ion: 1.2 mmol/L (ref 1.12–1.23)
Chloride: 100 mEq/L (ref 96–112)
Creatinine, Ser: 0.6 mg/dL (ref 0.50–1.10)
Glucose, Bld: 109 mg/dL — ABNORMAL HIGH (ref 70–99)
HEMATOCRIT: 43 % (ref 36.0–46.0)
Hemoglobin: 14.6 g/dL (ref 12.0–15.0)
POTASSIUM: 2.8 meq/L — AB (ref 3.7–5.3)
Sodium: 143 mEq/L (ref 137–147)
TCO2: 26 mmol/L (ref 0–100)

## 2013-04-07 LAB — I-STAT TROPONIN, ED: TROPONIN I, POC: 0 ng/mL (ref 0.00–0.08)

## 2013-04-07 LAB — URINE MICROSCOPIC-ADD ON

## 2013-04-07 LAB — PREGNANCY, URINE: Preg Test, Ur: NEGATIVE

## 2013-04-07 MED ORDER — IOHEXOL 300 MG/ML  SOLN: 100.0000 mL | Freq: Once | INTRAMUSCULAR | Status: DC | PRN

## 2013-04-07 MED ORDER — POTASSIUM CHLORIDE 20 MEQ/15ML (10%) PO LIQD
40.0000 meq | Freq: Once | ORAL | Status: AC
  Administered 2013-04-07: 40 meq via ORAL
  Filled 2013-04-07: qty 30

## 2013-04-07 MED ORDER — POTASSIUM CHLORIDE 10 MEQ/100ML IV SOLN
10.0000 meq | Freq: Once | INTRAVENOUS | Status: AC
  Administered 2013-04-07: 10 meq via INTRAVENOUS
  Filled 2013-04-07: qty 100

## 2013-04-07 MED ORDER — SODIUM CHLORIDE 0.9 % IV SOLN
INTRAVENOUS | Status: DC
  Administered 2013-04-07: 20:00:00 via INTRAVENOUS

## 2013-04-07 NOTE — ED Notes (Signed)
D/c instructions reviewed w/ pt - pt denies any further questions or concerns at present. Pt ambulating independently w/ steady gait on d/c in no acute distress, A&Ox4.  

## 2013-04-07 NOTE — ED Notes (Signed)
Notified EDP, Mcmaness, MD. i-stat Chem 8 potassium 2.8

## 2013-04-07 NOTE — ED Notes (Signed)
Notified RN, Koechert pt. Blood pressure elevated.

## 2013-04-07 NOTE — Discharge Instructions (Signed)
°Emergency Department Resource Guide °1) Find a Doctor and Pay Out of Pocket °Although you won't have to find out who is covered by your insurance plan, it is a good idea to ask around and get recommendations. You will then need to call the office and see if the doctor you have chosen will accept you as a new patient and what types of options they offer for patients who are self-pay. Some doctors offer discounts or will set up payment plans for their patients who do not have insurance, but you will need to ask so you aren't surprised when you get to your appointment. ° °2) Contact Your Local Health Department °Not all health departments have doctors that can see patients for sick visits, but many do, so it is worth a call to see if yours does. If you don't know where your local health department is, you can check in your phone book. The CDC also has a tool to help you locate your state's health department, and many state websites also have listings of all of their local health departments. ° °3) Find a Walk-in Clinic °If your illness is not likely to be very severe or complicated, you may want to try a walk in clinic. These are popping up all over the country in pharmacies, drugstores, and shopping centers. They're usually staffed by nurse practitioners or physician assistants that have been trained to treat common illnesses and complaints. They're usually fairly quick and inexpensive. However, if you have serious medical issues or chronic medical problems, these are probably not your best option. ° °No Primary Care Doctor: °- Call [facility] at  832-8000 - they can help you locate a primary care doctor that  accepts your insurance, provides certain services, etc. °- Physician Referral Service- 1-800-533-3463 ° °Chronic Pain Problems: °Organization         Address  Phone   Notes  °[facility]  (336) 297-2271 Patients need to be referred by their primary care doctor.  ° °Medication  Assistance: °Organization         Address  Phone   Notes  °[place] [facility] [address] °[place], [place] [address] (336) 641-8030 --Must be a resident of [place] °-- Must have NO insurance coverage whatsoever (no Medicaid/ Medicare, etc.) °-- The pt. MUST have a primary care doctor that directs their care regularly and follows them in the community °  °[facility]  (866) 331-1348   °[employer]  (888) 892-1162   ° °Agencies that provide inexpensive medical care: °Organization         Address  Phone   Notes  °[facility]  (336) 832-8035   °[facility]    (336) 832-7272   °[facility] [address] °[place],  [address] (336) 832-4777   °[facility] [address], °[place] (336) 271-4999   °[facility]    (336) 373-0678   °[facility]    (336) 272-1050   °[facility] ° [address], [place] Phone:  (336) 832-4444, Fax:  (336) 832-4440 Hours of Operation:  9 am - 6 pm, M-F.  Also accepts Medicaid/Medicare and self-pay.  ° [facility] ° [address], [place] Phone: (336) [phone], Fax: (336) [phone]. Hours of Operation:  8:30 am - 5:30 pm, M-F.  Also accepts Medicaid and self-pay.  °[facility] [place] [address], [place] Phone: (336) 878-6027   °[facility] [address], [place], [place] (336)723-1848, Ext. 123 Mondays & Thursdays: 7-9 AM.  First 15 patients are seen on a first come, first serve basis. °  ° °Medicaid-accepting [place] Providers: ° °Organization         Address  Phone   Notes  °[facility] [address], [place] (336) 641-2100 Also accepts self-pay patients.  °[facility] [address], [place] ° (336) 856-9996   °[facility] [address], [place]  (336) 288-8857   °[facility] [address] [place] [address], [place] (336) 299-7000   °Veita Bland [address], [place]  ° (336) 373-1557 Only accepts [place] Access Medicaid patients after they have their name applied to their card.  ° °Self-Pay (no insurance) in [place]: ° °Organization         Address  Phone   Notes  °Sickle Cell Patients, [facility] [address], [place] (336) 832-1970   °[facility] [address], [place] (336) 832-4400   °[facility] ° [address], [place] (336) 992-4800   °[facility]/Dr. Osei-Bonsu ° [address] [place] [address], [place] or [address], [place] (336) [phone] Phone number for both [place] and [place] locations is the same.  °[facility] [address], [place] (336) 299-0000   °[facility] [address] [place] [address], [place] or [address] (336) 852-7530 °(336) 878-2260   °[facility] [address], [place] (336) 350-1642, phone; (336) 294-5005, fax Sees patients 1st and 3rd Saturday of every month.  Must not qualify for public or private insurance (i.e. Medicaid, Medicare, [place] Health Choice, Veterans' Benefits) • Household income should be no more than 200% of the poverty level •The clinic cannot treat you if you are pregnant or think you are pregnant • Sexually transmitted diseases are not treated at the clinic.  ° ° °Dental Care: °Organization         Address  Phone  Notes  °[place] [facility] [address], [place] (336) 641-6152 Accepts children up to age 21 who are enrolled in Medicaid or [place] Health Choice; pregnant women with a Medicaid card; and children who have applied for Medicaid or [place] Health Choice, but were declined, whose parents can pay a reduced fee at time of service.  °[place]  [facility] [place]  [address], [place] (336) 641-7733 Accepts children up to age 21 who are enrolled in Medicaid or [place] Health Choice; pregnant women with a Medicaid card; and children who have applied for Medicaid or  Health Choice, but were declined, whose parents can pay a reduced fee at time of service.  °[facility] ° [address], [place] (336) 641-4533 Patients are seen by appointment only. Walk-ins are not accepted. [facility] will see patients 18 years of age and older. °Monday - Tuesday (8am-5pm) °Most Wednesdays (8:30-5pm) °$30 per visit, cash only  °[facility] ° [address], [place] (336) 641-4533 Patients are seen by appointment only. Walk-ins are not accepted. [facility] will see patients 18 years of age and older. °One   Wednesday Evening (Monthly: Volunteer Based).  $30 per visit, cash only  °[facility]  (919) [phone] for adults; Children under age 4, call Graduate Pediatric Dentistry at ([phone]) [phone]. Children aged 4-14, please call ([phone]) [phone] to request a pediatric application. ° Dental services are provided in all areas of dental care including fillings, crowns and bridges, complete and partial dentures, implants, gum treatment, root canals, and extractions. Preventive care is also provided. Treatment is provided to both adults and children. °Patients are selected via a lottery and there is often a waiting list. °  °[facility] [address], °[place] ° (336) 763-8833 [email or web address] °  °[facility] [address], [place], [place] (336)723-1848, Ext. 123 Second and Fourth Thursday of each month, opens at 6:30 AM; Clinic ends at 9 AM.  Patients are seen on a first-come first-served basis, and a limited number are seen during each clinic.  ° °[facility] ° [address], [place], [place] (336) 723-7904    Eligibility Requirements °You must have lived in [place], or [place] counties for at least the last three months. °  You cannot be eligible for state or federal sponsored healthcare insurance, including Veterans Administration, Medicaid, or Medicare. °  You generally cannot be eligible for healthcare insurance through your employer.  °  How to apply: °Eligibility screenings are held every Tuesday and Wednesday afternoon from 1:00 pm until 4:00 pm. You do not need an appointment for the interview!  °[facility] [address], [place], [place] 336-631-2330   °[facility]  336-342-8273   °[facility]  336-703-3100   °[facility]  336-570-6415   ° °Behavioral Health Resources in the Community: °Intensive Outpatient Programs °Organization         Address  Phone  Notes  °[facility] [address], [place], [place] 336-878-6098   °[facility] [address], [place], [place] 336-832-9800   °[facility] [address], [place], [place] ° 336-882-2125   °[facility] [address],  °[place], [place] 1-800-853-5163 or 336-641-4981   °Substance Abuse Resources °Organization         Address  Phone  Notes  °[facility]  336-882-2125   °[facility]  336-784-9470   °[facility]  336-285-9073   °[facility]  336-845-3988   °Residential & Outpatient Substance Abuse Program  1-800-659-3381   °Psychological Services °Organization         Address  Phone  Notes  °[facility]  336- 832-9600   °[facility]  336- 378-7881   °[facility] [address], [place] 1-800-853-5163 or 336-641-4981   ° °Mobile Crisis Teams °Organization         Address  Phone  Notes  °[facility], Mobile Crisis Care Unit  1-877-626-1772   °Assertive °[facility] ° [address]  [place], [place] 336-834-9664   °Sharon DeEsch [address] °[place] 336-554-5454   ° °Self-Help/Support Groups °Organization         Address  Phone             Notes  °[facility] - variety of support groups  336- 373-1402 Call for more information  °Narcotics Anonymous (NA), [facility] [address], °[place]   2 meetings at this location  ° °  Residential Treatment Programs Organization         Address  Phone  Notes  [facility] [address],    [place]  1-786-536-3083   [facility]  [address] [place] [address], [place], [place]   [place], [place] 856-268-7141 Admissions: 8am-3pm M-F  [facility] [place] [address],    [place], [place] [phone]   [facility] [address] [place], [place], [place]   [facility] [address],  [place], [place]   [facility] Intensive Outpatient [address], [place], [place]   [facility] ([place] [address]  [place], [place] 1-678-292-6168 or (207)542-0117   [facility] ([facility] [address] [place], [place] Accepts Medicaid  [facility] [address]  [place] 1-938-344-9392 Substance Abuse/Addiction Treatment   [facility] Organization         Address  Phone  Notes  [facility]  603-402-0053   Domenic Schwab, PhD [address] [place], [place]   (609)433-6375 or 5595929990   [facility] [address] [place], [place] 786-868-6472   [facility] [address] [place], [place] 240-869-7727 Insurance/Medicaid/sponsorship through [facility] [address] [place]                                    [place], [place] (807)[phone] [place] [address] [place], [place] [phone]    Dr. Adele Schilder  [phone]   [facility] Dept. 1) [address]  2) [place] 3)  [address], [place] [phone]  [phone]   [place] (408)[phone] or [phone] (After Hours)       Take over the counter tylenol and ibuprofen, as directed on packaging, as needed for discomfort.  Gargle with warm or cool water several times per day to help with discomfort. Continue to take your potassium pills. Call your regular medical doctor and the ENT doctor today to schedule a follow up appointment in the next 3 days.  Return to the Emergency Department immediately if worsening.

## 2013-04-07 NOTE — ED Notes (Signed)
Pt states she thinks she has a fishbone stuck in her throat since yesterday.  States that her throat is sore and she is getting a headache and having otalgia.  Denies NV, denies difficulty swallowing.

## 2013-04-07 NOTE — ED Notes (Signed)
Pt reports she feels she may have a fish bone stuck in her throat, first noted yesterday evening, pt states she has tried eating and drinking at home w/o relief of foreign body sensation. Pt handling secretions well, denies n/v - pt in no acute distress on assessment, voice clear , speaking complete sentences.

## 2013-04-07 NOTE — ED Provider Notes (Signed)
CSN: [id number]     Arrival date & time 04/07/13  1818 History   First MD Initiated Contact with Patient 04/07/13 1925     Chief Complaint  Patient presents with  . "fishbone in throat since yesterday"       HPI Pt was seen at [facility]. Per pt, c/o gradual onset and persistence of constant sore throat since last evening. Pt states she was "eating a fish with bones it in" and "felt like I swallowed a fishbone." Pt was able to finish her meal without choking or gagging. Pt continues to have FB sensation in her throat. Pt has been able to continue to eat and drink since yesterday without choking or gagging. Denies SOB/wheezing, no CP, no abd pain, no N/V.    Past Medical History  Diagnosis Date  . Hypertension   . Hypercholesteremia   . Depression   . Low blood potassium   . GERD (gastroesophageal reflux disease)   . Ovarian cyst   . Sleep apnea   . Hypokalemia    Past Surgical History  Procedure Laterality Date  . Partial hysterectomy  2009  . Examination under anesthesia N/A 10/30/2012    Procedure: EXAM UNDER ANESTHESIA;  Surgeon: Delice Lesch, MD;  Location: [facility];  Service: Gynecology;  Laterality: N/A;  . Laparoscopic lysis of adhesions N/A 10/30/2012    Procedure: LAPAROSCOPIC LYSIS OF ADHESIONS;  Surgeon: Delice Lesch, MD;  Location: [facility];  Service: Gynecology;  Laterality: N/A;  . Laparoscopy N/A 10/30/2012    Procedure: LAPAROSCOPY OPERATIVE;  Surgeon: Delice Lesch, MD;  Location: [facility];  Service: Gynecology;  Laterality: N/A;   Family History  Problem Relation Age of Onset  . Cancer Paternal Grandmother 47    bone  . Diabetes Paternal Grandmother   . Diabetes Father    History  Substance Use Topics  . Smoking status: Never Smoker   . Smokeless tobacco: Never Used  . Alcohol Use: No   OB History   Grav Para Term Preterm Abortions TAB SAB Ect Mult Living   3 2   1     2      Review of Systems ROS: Statement: All systems negative except as marked  or noted in the HPI; Constitutional: Negative for fever and chills. ; ; Eyes: Negative for eye pain, redness and discharge. ; ; ENMT: Negative for ear pain, hoarseness, nasal congestion, sinus pressure and +sore throat, FB sensation.. ; ; Cardiovascular: Negative for chest pain, palpitations, diaphoresis, dyspnea and peripheral edema. ; ; Respiratory: Negative for cough, wheezing and stridor. ; ; Gastrointestinal: Negative for nausea, vomiting, diarrhea, abdominal pain, blood in stool, hematemesis, jaundice and rectal bleeding. . ; ; Genitourinary: Negative for dysuria, flank pain and hematuria. ; ; Musculoskeletal: Negative for back pain and neck pain. Negative for swelling and trauma.; ; Skin: Negative for pruritus, rash, abrasions, blisters, bruising and skin lesion.; ; Neuro: Negative for headache, lightheadedness and neck stiffness. Negative for weakness, altered level of consciousness , altered mental status, extremity weakness, paresthesias, involuntary movement, seizure and syncope.      Allergies  Lisinopril and Tomato flavor  Home Medications   Current Outpatient Rx  Name  Route  Sig  Dispense  Refill  . acetaminophen (TYLENOL) 500 MG tablet   Oral   Take 500 mg by mouth every 6 (six) hours as needed for mild pain or headache.         Marland Kitchen amLODipine (NORVASC) 10 MG tablet   Oral  Take 10 mg by mouth every morning.         Marland Kitchen atorvastatin (LIPITOR) 20 MG tablet   Oral   Take 20 mg by mouth every evening.          . calcium-vitamin D (OSCAL WITH D) 500-200 MG-UNIT per tablet   Oral   Take 1 tablet by mouth 2 (two) times daily.         . carboxymethylcellulose (REFRESH PLUS) 0.5 % SOLN      1 drop 4 (four) times daily.         . carvedilol (COREG) 6.25 MG tablet   Oral   Take 12.5 mg by mouth 2 (two) times daily with a meal.          . cephALEXin (KEFLEX) 500 MG capsule   Oral   Take 500 mg by mouth 2 (two) times daily.         . cetirizine (ZYRTEC) 10 MG  tablet   Oral   Take 10 mg by mouth every morning.         . cycloSPORINE (RESTASIS) 0.05 % ophthalmic emulsion   Both Eyes   Place 1 drop into both eyes 2 (two) times daily.         . fluorometholone (FML) 0.1 % ophthalmic suspension   Both Eyes   Place 1 drop into both eyes 2 (two) times daily.         . furosemide (LASIX) 40 MG tablet   Oral   Take 40 mg by mouth every morning.          Marland Kitchen ibuprofen (ADVIL,MOTRIN) 200 MG tablet   Oral   Take 400 mg by mouth every 6 (six) hours as needed (pain).         Marland Kitchen omeprazole (PRILOSEC) 20 MG capsule   Oral   Take 20 mg by mouth every morning.         . potassium chloride (K-DUR) 10 MEQ tablet   Oral   Take 20 mEq by mouth 2 (two) times daily.          . traZODone (DESYREL) 50 MG tablet   Oral   Take 50 mg by mouth at bedtime.         . valsartan (DIOVAN) 80 MG tablet   Oral   Take 80 mg by mouth every morning.         . venlafaxine XR (EFFEXOR-XR) 75 MG 24 hr capsule   Oral   Take 75 mg by mouth daily with breakfast.          BP 159/101  Pulse 84  Temp(Src) 98.4 F (36.9 C) (Oral)  Resp 18  SpO2 95% Physical Exam 1950: Physical examination:  Nursing notes reviewed; Vital signs and O2 SAT reviewed;  Constitutional: Well developed, Well nourished, Well hydrated, In no acute distress; Head:  Normocephalic, atraumatic; Eyes: EOMI, PERRL, No scleral icterus; ENMT: TM's clear bilat. Mouth and pharynx without lesions. No tonsillar exudates. No intra-oral edema or bleeding. No submandibular or sublingual edema. No hoarse voice, no drooling, no stridor. No pain with manipulation of larynx. No trismus. Mouth and pharynx normal, Mucous membranes moist; Neck: Supple, Full range of motion, No lymphadenopathy; Cardiovascular: Regular rate and rhythm, No murmur, rub, or gallop; Respiratory: Breath sounds clear & equal bilaterally, No rales, rhonchi, wheezes.  Speaking full sentences with ease, Normal respiratory  effort/excursion; Chest: Nontender, Movement normal; Abdomen: Soft, Nontender, Nondistended, Normal bowel sounds; Genitourinary: No CVA tenderness; Extremities:  Pulses normal, No tenderness, No edema, No calf edema or asymmetry.; Neuro: AA&Ox3, Major CN grossly intact.  Speech clear. No gross focal motor or sensory deficits in extremities. Climbs on and off stretcher easily by herself. Gait steady.; Skin: Color normal, Warm, Dry.   ED Course  Procedures     EKG Interpretation None      MDM  MDM Reviewed: nursing note, vitals and previous chart Interpretation: labs and CT scan   Results for orders placed during the hospital encounter of 04/07/13  URINALYSIS, ROUTINE W REFLEX MICROSCOPIC      Result Value Ref Range   Color, Urine YELLOW  YELLOW   APPearance CLOUDY (*) CLEAR   Specific Gravity, Urine 1.008  1.005 - 1.030   pH 6.5  5.0 - 8.0   Glucose, UA NEGATIVE  NEGATIVE mg/dL   Hgb urine dipstick NEGATIVE  NEGATIVE   Bilirubin Urine NEGATIVE  NEGATIVE   Ketones, ur NEGATIVE  NEGATIVE mg/dL   Protein, ur NEGATIVE  NEGATIVE mg/dL   Urobilinogen, UA 0.2  0.0 - 1.0 mg/dL   Nitrite NEGATIVE  NEGATIVE   Leukocytes, UA SMALL (*) NEGATIVE  PREGNANCY, URINE      Result Value Ref Range   Preg Test, Ur NEGATIVE  NEGATIVE  URINE MICROSCOPIC-ADD ON      Result Value Ref Range   Squamous Epithelial / LPF MANY (*) RARE   WBC, UA 3-6  <3 WBC/hpf   Bacteria, UA RARE  RARE  I-STAT CHEM 8, ED      Result Value Ref Range   Sodium 143  137 - 147 mEq/L   Potassium 2.8 (*) 3.7 - 5.3 mEq/L   Chloride 100  96 - 112 mEq/L   BUN 5 (*) 6 - 23 mg/dL   Creatinine, Ser 0.60  0.50 - 1.10 mg/dL   Glucose, Bld 109 (*) 70 - 99 mg/dL   Calcium, Ion 1.20  1.12 - 1.23 mmol/L   TCO2 26  0 - 100 mmol/L   Hemoglobin 14.6  12.0 - 15.0 g/dL   HCT 43.0  36.0 - 46.0 %   Comment NOTIFIED PHYSICIAN    I-STAT TROPOININ, ED      Result Value Ref Range   Troponin i, poc 0.00  0.00 - 0.08 ng/mL   Comment 3             Ct Soft Tissue Neck W Contrast 04/07/2013   CLINICAL DATA:  Foreign body sensation in throat. Question chicken bone.  EXAM: CT NECK WITH CONTRAST  TECHNIQUE: Multidetector CT imaging of the neck was performed using the standard protocol following the bolus administration of intravenous contrast.  CONTRAST:  100 cc Omnipaque 300 IV.  COMPARISON:  None.  FINDINGS: Airways patent. No radiopaque foreign bodies. No retropharyngeal soft tissue abnormality. Epiglottis and aryepiglottic folds are normal.  Small hypodense nodules in the lower pole of the left thyroid lobe, the largest measuring 9 mm.  No cervical adenopathy or fluid collection. Parotid is and submandibular glands are unremarkable.  Early degenerative spurring in the mid and lower cervical spine.  IMPRESSION: No evidence of foreign body within the airway.  Airways patent.  Small left lower pole thyroid nodules.   Electronically Signed   By: Rolm Baptise M.D.   On: 04/07/2013 21:49    2255:  Pt has tol PO well while in the ED without N/V, choking or gagging. Potassium repleted IV and PO; pt states she has a rx for same but "didn't take  it today." CT scan without FB in throat. Pt states she wants to go home now. Dx and testing d/w pt.  Questions answered.  Verb understanding, agreeable to d/c home with outpt f/u.      Alfonzo Feller, DO 04/09/13 1402

## 2013-04-09 ENCOUNTER — Ambulatory Visit
Admission: RE | Admit: 2013-04-09 | Discharge: 2013-04-09 | Disposition: A | Discharge status: Home / Self Care | Payer: Medicare Other | Source: Ambulatory Visit | Attending: Obstetrics and Gynecology | Admitting: Obstetrics and Gynecology

## 2013-04-09 DIAGNOSIS — N83209 Unspecified ovarian cyst, unspecified side: Secondary | ICD-10-CM | POA: Diagnosis not present

## 2013-04-09 DIAGNOSIS — R102 Pelvic and perineal pain: Secondary | ICD-10-CM

## 2013-04-09 MED ORDER — GADOBENATE DIMEGLUMINE 529 MG/ML IV SOLN
17.0000 mL | Freq: Once | INTRAVENOUS | Status: AC | PRN
  Administered 2013-04-09: 17 mL via INTRAVENOUS

## 2013-04-16 DIAGNOSIS — L509 Urticaria, unspecified: Secondary | ICD-10-CM | POA: Diagnosis not present

## 2013-04-16 DIAGNOSIS — B3731 Acute candidiasis of vulva and vagina: Secondary | ICD-10-CM | POA: Diagnosis not present

## 2013-04-16 DIAGNOSIS — B373 Candidiasis of vulva and vagina: Secondary | ICD-10-CM | POA: Diagnosis not present

## 2013-04-19 DIAGNOSIS — L509 Urticaria, unspecified: Secondary | ICD-10-CM | POA: Diagnosis not present

## 2013-04-22 DIAGNOSIS — L509 Urticaria, unspecified: Secondary | ICD-10-CM | POA: Diagnosis not present

## 2013-04-22 DIAGNOSIS — R21 Rash and other nonspecific skin eruption: Secondary | ICD-10-CM | POA: Diagnosis not present

## 2013-05-04 DIAGNOSIS — Z124 Encounter for screening for malignant neoplasm of cervix: Secondary | ICD-10-CM | POA: Diagnosis not present

## 2013-05-04 DIAGNOSIS — IMO0002 Reserved for concepts with insufficient information to code with codable children: Secondary | ICD-10-CM | POA: Diagnosis not present

## 2013-05-04 DIAGNOSIS — R109 Unspecified abdominal pain: Secondary | ICD-10-CM | POA: Diagnosis not present

## 2013-05-04 DIAGNOSIS — N9989 Other postprocedural complications and disorders of genitourinary system: Secondary | ICD-10-CM | POA: Diagnosis not present

## 2013-06-01 DIAGNOSIS — L509 Urticaria, unspecified: Secondary | ICD-10-CM | POA: Diagnosis not present

## 2013-06-01 DIAGNOSIS — L259 Unspecified contact dermatitis, unspecified cause: Secondary | ICD-10-CM | POA: Diagnosis not present

## 2013-06-12 ENCOUNTER — Emergency Department (INDEPENDENT_AMBULATORY_CARE_PROVIDER_SITE_OTHER)
Admission: EM | Admit: 2013-06-12 | Discharge: 2013-06-12 | Disposition: A | Discharge status: Home / Self Care | Payer: Medicare Other | Source: Home / Self Care

## 2013-06-12 ENCOUNTER — Other Ambulatory Visit (HOSPITAL_COMMUNITY)
Admission: RE | Admit: 2013-06-12 | Discharge: 2013-06-12 | Disposition: A | Discharge status: Home / Self Care | Payer: Medicare Other | Source: Ambulatory Visit | Attending: Family Medicine | Admitting: Family Medicine

## 2013-06-12 ENCOUNTER — Encounter (HOSPITAL_COMMUNITY): Payer: Self-pay | Admitting: Emergency Medicine

## 2013-06-12 DIAGNOSIS — B373 Candidiasis of vulva and vagina: Secondary | ICD-10-CM | POA: Diagnosis not present

## 2013-06-12 DIAGNOSIS — B3731 Acute candidiasis of vulva and vagina: Secondary | ICD-10-CM | POA: Diagnosis not present

## 2013-06-12 DIAGNOSIS — Z113 Encounter for screening for infections with a predominantly sexual mode of transmission: Secondary | ICD-10-CM | POA: Diagnosis not present

## 2013-06-12 DIAGNOSIS — N760 Acute vaginitis: Secondary | ICD-10-CM | POA: Insufficient documentation

## 2013-06-12 LAB — POCT URINALYSIS DIP (DEVICE)
Bilirubin Urine: NEGATIVE
GLUCOSE, UA: NEGATIVE mg/dL
HGB URINE DIPSTICK: NEGATIVE
Ketones, ur: NEGATIVE mg/dL
Leukocytes, UA: NEGATIVE
Nitrite: NEGATIVE
PROTEIN: NEGATIVE mg/dL
Specific Gravity, Urine: 1.015 (ref 1.005–1.030)
UROBILINOGEN UA: 0.2 mg/dL (ref 0.0–1.0)
pH: 6 (ref 5.0–8.0)

## 2013-06-12 MED ORDER — FLUCONAZOLE 150 MG PO TABS
150.0000 mg | ORAL_TABLET | Freq: Once | ORAL | Status: DC
Start: 2013-06-12 — End: 2013-08-27

## 2013-06-12 MED ORDER — TERCONAZOLE 80 MG VA SUPP: 80.0000 mg | Freq: Every day | VAGINAL | Status: DC

## 2013-06-12 NOTE — ED Notes (Signed)
Pt  Reports  Symptoms  Of  Low  abd  Pain  With  Frequency  Of urination  With  Symptoms     X   1   Week       Pt  denys  Any  Nausea   Vomiting  Or  Diarrhea           Pt  Ambulated  To  Room with  Steady  Fluid  Gait

## 2013-06-12 NOTE — Discharge Instructions (Signed)
Use medicine as needed, see your doctor if further problems. °

## 2013-06-12 NOTE — ED Provider Notes (Signed)
CSN: [id number]     Arrival date & time 06/12/13  3545 History   None    Chief Complaint  Patient presents with  . Back Pain   (Consider location/radiation/quality/duration/timing/severity/associated sxs/prior Treatment) Patient is a 48 y.o. female presenting with back pain. The history is provided by the patient.  Back Pain Location:  Lumbar spine Quality:  Burning Pain severity:  Mild Onset quality:  Gradual Duration:  1 week Chronicity:  New Context comment:  I think I have a yeast infection. Associated symptoms: no dysuria     Past Medical History  Diagnosis Date  . Hypertension   . Hypercholesteremia   . Depression   . Low blood potassium   . GERD (gastroesophageal reflux disease)   . Ovarian cyst   . Sleep apnea   . Hypokalemia    Past Surgical History  Procedure Laterality Date  . Partial hysterectomy  2009  . Examination under anesthesia N/A 10/30/2012    Procedure: EXAM UNDER ANESTHESIA;  Surgeon: Delice Lesch, MD;  Location: [facility];  Service: Gynecology;  Laterality: N/A;  . Laparoscopic lysis of adhesions N/A 10/30/2012    Procedure: LAPAROSCOPIC LYSIS OF ADHESIONS;  Surgeon: Delice Lesch, MD;  Location: [facility];  Service: Gynecology;  Laterality: N/A;  . Laparoscopy N/A 10/30/2012    Procedure: LAPAROSCOPY OPERATIVE;  Surgeon: Delice Lesch, MD;  Location: [facility];  Service: Gynecology;  Laterality: N/A;   Family History  Problem Relation Age of Onset  . Cancer Paternal Grandmother 72    bone  . Diabetes Paternal Grandmother   . Diabetes Father    History  Substance Use Topics  . Smoking status: Never Smoker   . Smokeless tobacco: Never Used  . Alcohol Use: No   OB History   Grav Para Term Preterm Abortions TAB SAB Ect Mult Living   3 2   1     2      Review of Systems  Constitutional: Negative.   Genitourinary: Positive for vaginal discharge. Negative for dysuria, frequency, hematuria and vaginal pain.  Musculoskeletal: Positive for  back pain.    Allergies  Lisinopril and Tomato flavor  Home Medications   Prior to Admission medications   Medication Sig Start Date End Date Taking? Authorizing Provider  acetaminophen (TYLENOL) 500 MG tablet Take 500 mg by mouth every 6 (six) hours as needed for mild pain or headache.    Historical Provider, MD  amLODipine (NORVASC) 10 MG tablet Take 10 mg by mouth every morning.    Historical Provider, MD  atorvastatin (LIPITOR) 20 MG tablet Take 20 mg by mouth every evening.     Historical Provider, MD  calcium-vitamin D (OSCAL WITH D) 500-200 MG-UNIT per tablet Take 1 tablet by mouth 2 (two) times daily.    Historical Provider, MD  carboxymethylcellulose (REFRESH PLUS) 0.5 % SOLN 1 drop 4 (four) times daily.    Historical Provider, MD  carvedilol (COREG) 6.25 MG tablet Take 12.5 mg by mouth 2 (two) times daily with a meal.     Historical Provider, MD  cephALEXin (KEFLEX) 500 MG capsule Take 500 mg by mouth 2 (two) times daily.    Historical Provider, MD  cetirizine (ZYRTEC) 10 MG tablet Take 10 mg by mouth every morning.    Historical Provider, MD  cycloSPORINE (RESTASIS) 0.05 % ophthalmic emulsion Place 1 drop into both eyes 2 (two) times daily.    Historical Provider, MD  fluorometholone (FML) 0.1 % ophthalmic suspension Place 1 drop into  both eyes 2 (two) times daily.    Historical Provider, MD  furosemide (LASIX) 40 MG tablet Take 40 mg by mouth every morning.     Historical Provider, MD  ibuprofen (ADVIL,MOTRIN) 200 MG tablet Take 400 mg by mouth every 6 (six) hours as needed (pain).    Historical Provider, MD  omeprazole (PRILOSEC) 20 MG capsule Take 20 mg by mouth every morning.    Historical Provider, MD  potassium chloride (K-DUR) 10 MEQ tablet Take 20 mEq by mouth 2 (two) times daily.     Historical Provider, MD  traZODone (DESYREL) 50 MG tablet Take 50 mg by mouth at bedtime.    Historical Provider, MD  valsartan (DIOVAN) 80 MG tablet Take 80 mg by mouth every morning.     Historical Provider, MD  venlafaxine XR (EFFEXOR-XR) 75 MG 24 hr capsule Take 75 mg by mouth daily with breakfast.    Historical Provider, MD   BP 142/104  Pulse 99  Temp(Src) 97.9 F (36.6 C) (Oral)  Resp 18  SpO2 99% Physical Exam  Nursing note and vitals reviewed. Constitutional: She appears well-developed and well-nourished.  Abdominal: Soft. Bowel sounds are normal. She exhibits no distension and no mass. There is no hepatosplenomegaly. There is tenderness in the suprapubic area. There is no rebound, no guarding, no CVA tenderness, no tenderness at McBurney's point and negative Murphy's sign.  Skin: Skin is warm and dry.    ED Course  Procedures (including critical care time) Labs Review Labs Reviewed  POCT URINALYSIS DIP (DEVICE)    Imaging Review No results found.   MDM   1. Candida vaginitis       Billy Fischer, MD 06/12/13 [phone]

## 2013-06-23 DIAGNOSIS — N83209 Unspecified ovarian cyst, unspecified side: Secondary | ICD-10-CM | POA: Diagnosis not present

## 2013-08-16 DIAGNOSIS — E119 Type 2 diabetes mellitus without complications: Secondary | ICD-10-CM | POA: Diagnosis not present

## 2013-08-16 DIAGNOSIS — E876 Hypokalemia: Secondary | ICD-10-CM | POA: Diagnosis not present

## 2013-08-16 DIAGNOSIS — I10 Essential (primary) hypertension: Secondary | ICD-10-CM | POA: Diagnosis not present

## 2013-08-16 DIAGNOSIS — E7800 Pure hypercholesterolemia, unspecified: Secondary | ICD-10-CM | POA: Diagnosis not present

## 2013-08-27 ENCOUNTER — Encounter (HOSPITAL_COMMUNITY): Payer: Self-pay | Admitting: Emergency Medicine

## 2013-08-27 ENCOUNTER — Emergency Department (HOSPITAL_COMMUNITY): Payer: Medicare Other

## 2013-08-27 ENCOUNTER — Emergency Department (HOSPITAL_COMMUNITY)
Admission: EM | Admit: 2013-08-27 | Discharge: 2013-08-27 | Disposition: A | Discharge status: Home / Self Care | Payer: Medicare Other | Attending: Emergency Medicine | Admitting: Emergency Medicine

## 2013-08-27 DIAGNOSIS — E119 Type 2 diabetes mellitus without complications: Secondary | ICD-10-CM | POA: Diagnosis not present

## 2013-08-27 DIAGNOSIS — M79609 Pain in unspecified limb: Secondary | ICD-10-CM | POA: Diagnosis not present

## 2013-08-27 DIAGNOSIS — IMO0002 Reserved for concepts with insufficient information to code with codable children: Principal | ICD-10-CM

## 2013-08-27 DIAGNOSIS — I10 Essential (primary) hypertension: Secondary | ICD-10-CM | POA: Insufficient documentation

## 2013-08-27 DIAGNOSIS — K219 Gastro-esophageal reflux disease without esophagitis: Secondary | ICD-10-CM | POA: Diagnosis not present

## 2013-08-27 DIAGNOSIS — Z79899 Other long term (current) drug therapy: Secondary | ICD-10-CM | POA: Insufficient documentation

## 2013-08-27 DIAGNOSIS — E7800 Pure hypercholesterolemia, unspecified: Secondary | ICD-10-CM | POA: Diagnosis not present

## 2013-08-27 DIAGNOSIS — F329 Major depressive disorder, single episode, unspecified: Secondary | ICD-10-CM | POA: Diagnosis not present

## 2013-08-27 DIAGNOSIS — Z8742 Personal history of other diseases of the female genital tract: Secondary | ICD-10-CM | POA: Insufficient documentation

## 2013-08-27 DIAGNOSIS — F3289 Other specified depressive episodes: Secondary | ICD-10-CM | POA: Insufficient documentation

## 2013-08-27 DIAGNOSIS — M1711 Unilateral primary osteoarthritis, right knee: Secondary | ICD-10-CM

## 2013-08-27 DIAGNOSIS — E876 Hypokalemia: Secondary | ICD-10-CM | POA: Diagnosis not present

## 2013-08-27 DIAGNOSIS — M1710 Unilateral primary osteoarthritis, unspecified knee: Secondary | ICD-10-CM | POA: Diagnosis not present

## 2013-08-27 DIAGNOSIS — M25869 Other specified joint disorders, unspecified knee: Secondary | ICD-10-CM | POA: Diagnosis not present

## 2013-08-27 DIAGNOSIS — Z792 Long term (current) use of antibiotics: Secondary | ICD-10-CM | POA: Diagnosis not present

## 2013-08-27 HISTORY — DX: Type 2 diabetes mellitus without complications: E11.9

## 2013-08-27 MED ORDER — HYDROCODONE-ACETAMINOPHEN 5-325 MG PO TABS: 1.0000 | ORAL_TABLET | Freq: Four times a day (QID) | ORAL | Status: DC | PRN

## 2013-08-27 MED ORDER — PREDNISONE 50 MG PO TABS: 50.0000 mg | ORAL_TABLET | Freq: Every day | ORAL | Status: DC

## 2013-08-27 NOTE — ED Provider Notes (Signed)
CSN: [id number]     Arrival date & time 08/27/13  1201 History  This chart was scribed for non-physician practitioner Irena Cords, working with Evelina Bucy, MD by Ludger Nutting, ED Scribe. This patient was seen in room [facility] and the patient's care was started at 12:35 PM.    Chief Complaint  Patient presents with  . Leg Pain    The history is provided by the patient. No language interpreter was used.    HPI Comments: Barbara [name] is a 48 y.o. female who presents to the Emergency Department complaining of 1 month of constant, gradually worsened right knee pain with associated right hip pain. Patient states she has pain to the left knee as well but it is milder. She denies recent injury or trauma. She denies similar symptoms in the past. She takes ibuprofen and tylenol without relief. She states standing or sitting for long periods of times aggravates her pain.   Past Medical History  Diagnosis Date  . Hypertension   . Hypercholesteremia   . Depression   . Low blood potassium   . GERD (gastroesophageal reflux disease)   . Ovarian cyst   . Sleep apnea   . Hypokalemia   . Diabetes mellitus without complication    Past Surgical History  Procedure Laterality Date  . Partial hysterectomy  2009  . Examination under anesthesia N/A 10/30/2012    Procedure: EXAM UNDER ANESTHESIA;  Surgeon: Delice Lesch, MD;  Location: [facility];  Service: Gynecology;  Laterality: N/A;  . Laparoscopic lysis of adhesions N/A 10/30/2012    Procedure: LAPAROSCOPIC LYSIS OF ADHESIONS;  Surgeon: Delice Lesch, MD;  Location: [facility];  Service: Gynecology;  Laterality: N/A;  . Laparoscopy N/A 10/30/2012    Procedure: LAPAROSCOPY OPERATIVE;  Surgeon: Delice Lesch, MD;  Location: [facility];  Service: Gynecology;  Laterality: N/A;   Family History  Problem Relation Age of Onset  . Cancer Paternal Grandmother 47    bone  . Diabetes Paternal Grandmother   . Diabetes Father    History  Substance Use  Topics  . Smoking status: Never Smoker   . Smokeless tobacco: Never Used  . Alcohol Use: No   OB History   Grav Para Term Preterm Abortions TAB SAB Ect Mult Living   3 2   1     2      Review of Systems  A complete 10 system review of systems was obtained and all systems are negative except as noted in the HPI and PMH.    Allergies  Lisinopril and Tomato flavor  Home Medications   Prior to Admission medications   Medication Sig Start Date End Date Taking? Authorizing Provider  acetaminophen (TYLENOL) 500 MG tablet Take 500 mg by mouth every 6 (six) hours as needed for mild pain or headache.    Historical Provider, MD  amLODipine (NORVASC) 10 MG tablet Take 10 mg by mouth every morning.    Historical Provider, MD  atorvastatin (LIPITOR) 20 MG tablet Take 20 mg by mouth every evening.     Historical Provider, MD  calcium-vitamin D (OSCAL WITH D) 500-200 MG-UNIT per tablet Take 1 tablet by mouth 2 (two) times daily.    Historical Provider, MD  carboxymethylcellulose (REFRESH PLUS) 0.5 % SOLN 1 drop 4 (four) times daily.    Historical Provider, MD  carvedilol (COREG) 6.25 MG tablet Take 12.5 mg by mouth 2 (two) times daily with a meal.     Historical Provider, MD  cephALEXin (KEFLEX) 500 MG capsule Take 500 mg by mouth 2 (two) times daily.    Historical Provider, MD  cetirizine (ZYRTEC) 10 MG tablet Take 10 mg by mouth every morning.    Historical Provider, MD  cycloSPORINE (RESTASIS) 0.05 % ophthalmic emulsion Place 1 drop into both eyes 2 (two) times daily.    Historical Provider, MD  fluconazole (DIFLUCAN) 150 MG tablet Take 1 tablet (150 mg total) by mouth once. May repeat in 1 week. 06/12/13   Billy Fischer, MD  fluorometholone (FML) 0.1 % ophthalmic suspension Place 1 drop into both eyes 2 (two) times daily.    Historical Provider, MD  furosemide (LASIX) 40 MG tablet Take 40 mg by mouth every morning.     Historical Provider, MD  ibuprofen (ADVIL,MOTRIN) 200 MG tablet Take 400 mg by  mouth every 6 (six) hours as needed (pain).    Historical Provider, MD  omeprazole (PRILOSEC) 20 MG capsule Take 20 mg by mouth every morning.    Historical Provider, MD  potassium chloride (K-DUR) 10 MEQ tablet Take 20 mEq by mouth 2 (two) times daily.     Historical Provider, MD  terconazole (TERAZOL 3) 80 MG vaginal suppository Place 1 suppository (80 mg total) vaginally at bedtime. 06/12/13   Billy Fischer, MD  traZODone (DESYREL) 50 MG tablet Take 50 mg by mouth at bedtime.    Historical Provider, MD  valsartan (DIOVAN) 80 MG tablet Take 80 mg by mouth every morning.    Historical Provider, MD  venlafaxine XR (EFFEXOR-XR) 75 MG 24 hr capsule Take 75 mg by mouth daily with breakfast.    Historical Provider, MD   BP 123/77  Pulse 74  Temp(Src) 98.3 F (36.8 C) (Oral)  Resp 16  SpO2 95% Physical Exam  Nursing note and vitals reviewed. Constitutional: She is oriented to person, place, and time. She appears well-developed and well-nourished.  HENT:  Head: Normocephalic and atraumatic.  Cardiovascular: Normal rate.   Pulmonary/Chest: Effort normal.  Musculoskeletal: She exhibits no edema and no tenderness.  Pain with ROM of right knee.   Neurological: She is alert and oriented to person, place, and time.  Skin: Skin is warm and dry.  Psychiatric: She has a normal mood and affect.    ED Course  Procedures (including critical care time)  DIAGNOSTIC STUDIES: Oxygen Saturation is 95% on RA, adequate by my interpretation.    COORDINATION OF CARE: 12:35 PM Discussed treatment plan with pt at bedside and pt agreed to plan.  The patient is advised to followup with her primary care Dr. told to return here as needed  I personally performed the services described in this documentation, which was scribed in my presence. The recorded information has been reviewed and is accurate.   Brent General, PA-C 08/30/13 0031

## 2013-08-27 NOTE — ED Notes (Signed)
Pt reports bilateral leg pain that has been ongoing for several months. Pt denies having a fall or injury. Pt is ambulatory to exam room without difficulty. Pt is A/O x4, in NAD, and vitals are WDL.

## 2013-08-27 NOTE — Discharge Instructions (Signed)
REturn here as needed. Follow up with your doctor. Ice and heat to your knees. Your x-rays show arthritis

## 2013-08-31 NOTE — ED Provider Notes (Signed)
Medical screening examination/treatment/procedure(s) were performed by non-physician practitioner and as supervising physician I was immediately available for consultation/collaboration.   EKG Interpretation None        Evelina Bucy, MD 08/31/13 1122

## 2013-09-13 DIAGNOSIS — I10 Essential (primary) hypertension: Secondary | ICD-10-CM | POA: Diagnosis not present

## 2013-09-13 DIAGNOSIS — K219 Gastro-esophageal reflux disease without esophagitis: Secondary | ICD-10-CM | POA: Diagnosis not present

## 2013-09-13 DIAGNOSIS — M25569 Pain in unspecified knee: Secondary | ICD-10-CM | POA: Diagnosis not present

## 2013-09-13 DIAGNOSIS — E119 Type 2 diabetes mellitus without complications: Secondary | ICD-10-CM | POA: Diagnosis not present

## 2013-09-21 DIAGNOSIS — E876 Hypokalemia: Secondary | ICD-10-CM | POA: Diagnosis not present

## 2013-11-19 ENCOUNTER — Encounter (HOSPITAL_COMMUNITY): Payer: Self-pay | Admitting: Emergency Medicine

## 2013-11-19 ENCOUNTER — Emergency Department (HOSPITAL_COMMUNITY)
Admission: EM | Admit: 2013-11-19 | Discharge: 2013-11-19 | Disposition: A | Discharge status: Home / Self Care | Payer: Medicare Other | Attending: Emergency Medicine | Admitting: Emergency Medicine

## 2013-11-19 DIAGNOSIS — Z79899 Other long term (current) drug therapy: Secondary | ICD-10-CM | POA: Insufficient documentation

## 2013-11-19 DIAGNOSIS — Z7952 Long term (current) use of systemic steroids: Secondary | ICD-10-CM | POA: Insufficient documentation

## 2013-11-19 DIAGNOSIS — E876 Hypokalemia: Secondary | ICD-10-CM | POA: Insufficient documentation

## 2013-11-19 DIAGNOSIS — J020 Streptococcal pharyngitis: Secondary | ICD-10-CM | POA: Insufficient documentation

## 2013-11-19 DIAGNOSIS — I10 Essential (primary) hypertension: Secondary | ICD-10-CM | POA: Insufficient documentation

## 2013-11-19 DIAGNOSIS — K219 Gastro-esophageal reflux disease without esophagitis: Secondary | ICD-10-CM | POA: Insufficient documentation

## 2013-11-19 DIAGNOSIS — Z8742 Personal history of other diseases of the female genital tract: Secondary | ICD-10-CM | POA: Insufficient documentation

## 2013-11-19 DIAGNOSIS — R0981 Nasal congestion: Secondary | ICD-10-CM | POA: Insufficient documentation

## 2013-11-19 DIAGNOSIS — E780 Pure hypercholesterolemia: Secondary | ICD-10-CM | POA: Insufficient documentation

## 2013-11-19 DIAGNOSIS — R110 Nausea: Secondary | ICD-10-CM | POA: Insufficient documentation

## 2013-11-19 DIAGNOSIS — Z8669 Personal history of other diseases of the nervous system and sense organs: Secondary | ICD-10-CM | POA: Insufficient documentation

## 2013-11-19 DIAGNOSIS — E119 Type 2 diabetes mellitus without complications: Secondary | ICD-10-CM | POA: Insufficient documentation

## 2013-11-19 DIAGNOSIS — J029 Acute pharyngitis, unspecified: Secondary | ICD-10-CM | POA: Diagnosis present

## 2013-11-19 DIAGNOSIS — Z8659 Personal history of other mental and behavioral disorders: Secondary | ICD-10-CM | POA: Insufficient documentation

## 2013-11-19 LAB — RAPID STREP SCREEN (MED CTR MEBANE ONLY): Streptococcus, Group A Screen (Direct): POSITIVE — AB

## 2013-11-19 MED ORDER — PROMETHAZINE HCL 25 MG PO TABS: 25.0000 mg | ORAL_TABLET | Freq: Four times a day (QID) | ORAL | Status: DC | PRN

## 2013-11-19 MED ORDER — ONDANSETRON 4 MG PO TBDP
4.0000 mg | ORAL_TABLET | Freq: Once | ORAL | Status: AC
  Administered 2013-11-19: 4 mg via ORAL
  Filled 2013-11-19: qty 1

## 2013-11-19 MED ORDER — AMOXICILLIN 500 MG PO CAPS: 500.0000 mg | ORAL_CAPSULE | Freq: Two times a day (BID) | ORAL | Status: DC

## 2013-11-19 MED ORDER — IBUPROFEN 200 MG PO TABS
600.0000 mg | ORAL_TABLET | Freq: Once | ORAL | Status: AC
  Administered 2013-11-19: 600 mg via ORAL
  Filled 2013-11-19: qty 3

## 2013-11-19 MED ORDER — IBUPROFEN 600 MG PO TABS: 600.0000 mg | ORAL_TABLET | Freq: Four times a day (QID) | ORAL | Status: DC | PRN

## 2013-11-19 MED ORDER — ACETAMINOPHEN 325 MG PO TABS
650.0000 mg | ORAL_TABLET | Freq: Once | ORAL | Status: AC
  Administered 2013-11-19: 650 mg via ORAL
  Filled 2013-11-19: qty 2

## 2013-11-19 NOTE — ED Provider Notes (Signed)
Medical screening examination/treatment/procedure(s) were performed by non-physician practitioner and as supervising physician I was immediately available for consultation/collaboration.   EKG Interpretation None       Threasa Beards, MD 11/19/13 [phone]

## 2013-11-19 NOTE — ED Notes (Signed)
Patient c/o sore throat, sinus congestion, generalized body aches, chills, and headache x 2 days.

## 2013-11-19 NOTE — ED Provider Notes (Signed)
CSN: [id number]     Arrival date & time 11/19/13  0745 History   First MD Initiated Contact with Patient 11/19/13 0800     Chief Complaint  Patient presents with  . Sore Throat     (Consider location/radiation/quality/duration/timing/severity/associated sxs/prior Treatment) HPI Pt is a 48yo female with c/o sore throat, sinus congestion, generalized body aches, chills, and headache x2 days.  Pt also c/o nausea but denies vomiting or abdominal pain. Pt reports taking acetaminophen 1 time yesterday w/o relief of symptoms. Reports being able to keep down fluids and denies difficulty breathing but does report worsening throat pain with swallowing, 9/10 at worst.  Pain is constant burning, aching and sore.  Denies known sick contacts or recent travel. Denies hx of asthma. Denies getting flu vaccine this year. Pt has never smoked.   Past Medical History  Diagnosis Date  . Hypertension   . Hypercholesteremia   . Depression   . Low blood potassium   . GERD (gastroesophageal reflux disease)   . Ovarian cyst   . Sleep apnea   . Hypokalemia   . Diabetes mellitus without complication    Past Surgical History  Procedure Laterality Date  . Partial hysterectomy  2009  . Examination under anesthesia N/A 10/30/2012    Procedure: EXAM UNDER ANESTHESIA;  Surgeon: Delice Lesch, MD;  Location: [facility];  Service: Gynecology;  Laterality: N/A;  . Laparoscopic lysis of adhesions N/A 10/30/2012    Procedure: LAPAROSCOPIC LYSIS OF ADHESIONS;  Surgeon: Delice Lesch, MD;  Location: [facility];  Service: Gynecology;  Laterality: N/A;  . Laparoscopy N/A 10/30/2012    Procedure: LAPAROSCOPY OPERATIVE;  Surgeon: Delice Lesch, MD;  Location: [facility];  Service: Gynecology;  Laterality: N/A;   Family History  Problem Relation Age of Onset  . Cancer Paternal Grandmother 14    bone  . Diabetes Paternal Grandmother   . Diabetes Father    History  Substance Use Topics  . Smoking status: Never Smoker   .  Smokeless tobacco: Never Used  . Alcohol Use: No   OB History   Grav Para Term Preterm Abortions TAB SAB Ect Mult Living   3 2   1     2      Review of Systems  Constitutional: Positive for fever, chills and appetite change.  HENT: Positive for congestion and sore throat. Negative for ear pain, rhinorrhea, sinus pressure, trouble swallowing and voice change.   Respiratory: Positive for cough. Negative for shortness of breath.   Gastrointestinal: Positive for nausea. Negative for vomiting and abdominal pain.  All other systems reviewed and are negative.     Allergies  Lisinopril and Tomato flavor  Home Medications   Prior to Admission medications   Medication Sig Start Date End Date Taking? Authorizing Provider  acetaminophen (TYLENOL) 500 MG tablet Take 1,000 mg by mouth every 6 (six) hours as needed for mild pain or headache.    Yes Historical Provider, MD  amLODipine (NORVASC) 10 MG tablet Take 10 mg by mouth every morning.   Yes Historical Provider, MD  atorvastatin (LIPITOR) 20 MG tablet Take 20 mg by mouth every evening.    Yes Historical Provider, MD  calcium-vitamin D (OSCAL WITH D) 500-200 MG-UNIT per tablet Take 1 tablet by mouth 3 (three) times daily.    Yes Historical Provider, MD  carboxymethylcellulose (REFRESH PLUS) 0.5 % SOLN Place 1 drop into both eyes 2 (two) times daily.    Yes Historical Provider, MD  carvedilol (  COREG) 6.25 MG tablet Take 12.5 mg by mouth 2 (two) times daily with a meal.    Yes Historical Provider, MD  cetirizine (ZYRTEC) 10 MG tablet Take 10 mg by mouth every morning.   Yes Historical Provider, MD  cycloSPORINE (RESTASIS) 0.05 % ophthalmic emulsion Place 1 drop into both eyes 2 (two) times daily.   Yes Historical Provider, MD  fluorometholone (FML) 0.1 % ophthalmic suspension Place 1 drop into both eyes 2 (two) times daily as needed (for dry eyes).    Yes Historical Provider, MD  furosemide (LASIX) 40 MG tablet Take 40 mg by mouth every morning.     Yes Historical Provider, MD  metFORMIN (GLUCOPHAGE) 500 MG tablet Take 500 mg by mouth daily with breakfast.    Yes Historical Provider, MD  omeprazole (PRILOSEC) 20 MG capsule Take 20 mg by mouth every morning.   Yes Historical Provider, MD  potassium chloride SA (K-DUR,KLOR-CON) 20 MEQ tablet Take 20 mEq by mouth 3 (three) times daily.    Yes Historical Provider, MD  predniSONE (DELTASONE) 50 MG tablet Take 1 tablet (50 mg total) by mouth daily. 08/27/13  Yes Resa Miner Lawyer, PA-C  valsartan (DIOVAN) 80 MG tablet Take 80 mg by mouth every morning.   Yes Historical Provider, MD  amoxicillin (AMOXIL) 500 MG capsule Take 1 capsule (500 mg total) by mouth 2 (two) times daily. 11/19/13   Noland Fordyce, PA-C  ibuprofen (ADVIL,MOTRIN) 600 MG tablet Take 1 tablet (600 mg total) by mouth every 6 (six) hours as needed. 11/19/13   Noland Fordyce, PA-C  promethazine (PHENERGAN) 25 MG tablet Take 1 tablet (25 mg total) by mouth every 6 (six) hours as needed for nausea or vomiting. 11/19/13   Noland Fordyce, PA-C   BP 136/85  Pulse 87  Temp(Src) 99.1 F (37.3 C) (Oral)  Resp 18  SpO2 93% Physical Exam  Nursing note and vitals reviewed. Constitutional: She appears well-developed and well-nourished. No distress.  HENT:  Head: Normocephalic and atraumatic.  Right Ear: Hearing, tympanic membrane, external ear and ear canal normal.  Left Ear: Hearing, tympanic membrane, external ear and ear canal normal.  Nose: Mucosal edema and rhinorrhea present.  Mouth/Throat: Uvula is midline and mucous membranes are normal. Oropharyngeal exudate, posterior oropharyngeal edema and posterior oropharyngeal erythema present. No tonsillar abscesses.  Eyes: Conjunctivae are normal. No scleral icterus.  Neck: Normal range of motion.  Cardiovascular: Normal rate, regular rhythm and normal heart sounds.   Regular rate and rhythm  Pulmonary/Chest: Effort normal and breath sounds normal. No respiratory distress. She has no  wheezes. She has no rales. She exhibits no tenderness.  Lungs: CTAB  Abdominal: Soft. Bowel sounds are normal. She exhibits no distension and no mass. There is no tenderness. There is no rebound and no guarding.  Musculoskeletal: Normal range of motion.  Neurological: She is alert.  Skin: Skin is warm and dry. She is not diaphoretic.    ED Course  Procedures (including critical care time) Labs Review Labs Reviewed  RAPID STREP SCREEN - Abnormal; Notable for the following:    Streptococcus, Group A Screen (Direct) POSITIVE (*)    All other components within normal limits    Imaging Review No results found.   EKG Interpretation None      MDM   Final diagnoses:  Strep pharyngitis    Pt c/o flu-like symptoms, Oropharynx: tonsillar edema, erythema and exudate. Temp- 100.7 upon arrival, vitals otherwise unremarkable. Pt given ibuprofen, acetaminophen and zofran.  Lungs: CTAB,  Abd-soft, nontender.  Rapid strep: positive. Pt able to keep down several ounces of fluids.  States she feels comfortable being discharged home. Rx: amoxicillin, ibuprofen and phenergan. Home care instructions provided. Return precautions provided. Pt verbalized understanding and agreement with tx plan.     Noland Fordyce, PA-C 11/19/13 0930

## 2013-11-19 NOTE — ED Notes (Signed)
Patient given water for fluid challenge as per her request.

## 2013-11-22 ENCOUNTER — Encounter (HOSPITAL_COMMUNITY): Payer: Self-pay | Admitting: Emergency Medicine

## 2014-02-14 DIAGNOSIS — Z1389 Encounter for screening for other disorder: Secondary | ICD-10-CM | POA: Diagnosis not present

## 2014-02-14 DIAGNOSIS — E119 Type 2 diabetes mellitus without complications: Secondary | ICD-10-CM | POA: Diagnosis not present

## 2014-02-14 DIAGNOSIS — L259 Unspecified contact dermatitis, unspecified cause: Secondary | ICD-10-CM | POA: Diagnosis not present

## 2014-02-14 DIAGNOSIS — I10 Essential (primary) hypertension: Secondary | ICD-10-CM | POA: Diagnosis not present

## 2014-02-14 DIAGNOSIS — E780 Pure hypercholesterolemia: Secondary | ICD-10-CM | POA: Diagnosis not present

## 2014-02-14 DIAGNOSIS — K219 Gastro-esophageal reflux disease without esophagitis: Secondary | ICD-10-CM | POA: Diagnosis not present

## 2014-02-17 DIAGNOSIS — Z1231 Encounter for screening mammogram for malignant neoplasm of breast: Secondary | ICD-10-CM | POA: Diagnosis not present

## 2014-03-14 DIAGNOSIS — F331 Major depressive disorder, recurrent, moderate: Secondary | ICD-10-CM | POA: Diagnosis not present

## 2014-03-15 DIAGNOSIS — F331 Major depressive disorder, recurrent, moderate: Secondary | ICD-10-CM | POA: Diagnosis not present

## 2014-03-16 DIAGNOSIS — M7061 Trochanteric bursitis, right hip: Secondary | ICD-10-CM | POA: Diagnosis not present

## 2014-03-16 DIAGNOSIS — M25561 Pain in right knee: Secondary | ICD-10-CM | POA: Diagnosis not present

## 2014-03-16 DIAGNOSIS — M25551 Pain in right hip: Secondary | ICD-10-CM | POA: Diagnosis not present

## 2014-03-29 DIAGNOSIS — E780 Pure hypercholesterolemia: Secondary | ICD-10-CM | POA: Diagnosis not present

## 2014-05-02 DIAGNOSIS — G4733 Obstructive sleep apnea (adult) (pediatric): Secondary | ICD-10-CM | POA: Diagnosis not present

## 2014-05-12 DIAGNOSIS — Z01411 Encounter for gynecological examination (general) (routine) with abnormal findings: Secondary | ICD-10-CM | POA: Diagnosis not present

## 2014-05-12 DIAGNOSIS — N8320 Unspecified ovarian cysts: Secondary | ICD-10-CM | POA: Diagnosis not present

## 2014-05-12 DIAGNOSIS — N760 Acute vaginitis: Secondary | ICD-10-CM | POA: Diagnosis not present

## 2014-05-12 DIAGNOSIS — Z1231 Encounter for screening mammogram for malignant neoplasm of breast: Secondary | ICD-10-CM | POA: Diagnosis not present

## 2014-05-12 DIAGNOSIS — Z6835 Body mass index (BMI) 35.0-35.9, adult: Secondary | ICD-10-CM | POA: Diagnosis not present

## 2014-05-12 DIAGNOSIS — N898 Other specified noninflammatory disorders of vagina: Secondary | ICD-10-CM | POA: Diagnosis not present

## 2014-05-12 DIAGNOSIS — Z1272 Encounter for screening for malignant neoplasm of vagina: Secondary | ICD-10-CM | POA: Diagnosis not present

## 2014-05-12 DIAGNOSIS — Z01419 Encounter for gynecological examination (general) (routine) without abnormal findings: Secondary | ICD-10-CM | POA: Diagnosis not present

## 2014-05-25 DIAGNOSIS — E119 Type 2 diabetes mellitus without complications: Secondary | ICD-10-CM | POA: Diagnosis not present

## 2014-05-25 DIAGNOSIS — H40033 Anatomical narrow angle, bilateral: Secondary | ICD-10-CM | POA: Diagnosis not present

## 2014-07-28 DIAGNOSIS — F331 Major depressive disorder, recurrent, moderate: Secondary | ICD-10-CM | POA: Diagnosis not present

## 2014-08-15 DIAGNOSIS — E119 Type 2 diabetes mellitus without complications: Secondary | ICD-10-CM | POA: Diagnosis not present

## 2014-08-15 DIAGNOSIS — I10 Essential (primary) hypertension: Secondary | ICD-10-CM | POA: Diagnosis not present

## 2014-08-15 DIAGNOSIS — E780 Pure hypercholesterolemia: Secondary | ICD-10-CM | POA: Diagnosis not present

## 2014-11-15 DIAGNOSIS — E119 Type 2 diabetes mellitus without complications: Secondary | ICD-10-CM | POA: Diagnosis not present

## 2014-11-15 DIAGNOSIS — Z0000 Encounter for general adult medical examination without abnormal findings: Secondary | ICD-10-CM | POA: Diagnosis not present

## 2014-11-15 DIAGNOSIS — E876 Hypokalemia: Secondary | ICD-10-CM | POA: Diagnosis not present

## 2015-02-15 DIAGNOSIS — Z7984 Long term (current) use of oral hypoglycemic drugs: Secondary | ICD-10-CM | POA: Diagnosis not present

## 2015-02-15 DIAGNOSIS — I10 Essential (primary) hypertension: Secondary | ICD-10-CM | POA: Diagnosis not present

## 2015-02-15 DIAGNOSIS — E119 Type 2 diabetes mellitus without complications: Secondary | ICD-10-CM | POA: Diagnosis not present

## 2015-02-15 DIAGNOSIS — F325 Major depressive disorder, single episode, in full remission: Secondary | ICD-10-CM | POA: Diagnosis not present

## 2015-02-15 DIAGNOSIS — E7800 Pure hypercholesterolemia, unspecified: Secondary | ICD-10-CM | POA: Diagnosis not present

## 2015-02-15 DIAGNOSIS — K219 Gastro-esophageal reflux disease without esophagitis: Secondary | ICD-10-CM | POA: Diagnosis not present

## 2015-02-15 DIAGNOSIS — Z1389 Encounter for screening for other disorder: Secondary | ICD-10-CM | POA: Diagnosis not present

## 2015-02-20 DIAGNOSIS — Z1231 Encounter for screening mammogram for malignant neoplasm of breast: Secondary | ICD-10-CM | POA: Diagnosis not present

## 2015-02-21 DIAGNOSIS — N898 Other specified noninflammatory disorders of vagina: Secondary | ICD-10-CM | POA: Diagnosis not present

## 2015-02-21 DIAGNOSIS — N83202 Unspecified ovarian cyst, left side: Secondary | ICD-10-CM | POA: Diagnosis not present

## 2015-02-21 DIAGNOSIS — R102 Pelvic and perineal pain: Secondary | ICD-10-CM | POA: Diagnosis not present

## 2015-04-03 DIAGNOSIS — R102 Pelvic and perineal pain: Secondary | ICD-10-CM | POA: Diagnosis not present

## 2015-04-03 DIAGNOSIS — N83202 Unspecified ovarian cyst, left side: Secondary | ICD-10-CM | POA: Diagnosis not present

## 2015-05-01 DIAGNOSIS — G4733 Obstructive sleep apnea (adult) (pediatric): Secondary | ICD-10-CM | POA: Diagnosis not present

## 2015-05-24 DIAGNOSIS — H40033 Anatomical narrow angle, bilateral: Secondary | ICD-10-CM | POA: Diagnosis not present

## 2015-05-24 DIAGNOSIS — H04123 Dry eye syndrome of bilateral lacrimal glands: Secondary | ICD-10-CM | POA: Diagnosis not present

## 2015-06-07 DIAGNOSIS — M25551 Pain in right hip: Secondary | ICD-10-CM | POA: Diagnosis not present

## 2015-06-07 DIAGNOSIS — M1711 Unilateral primary osteoarthritis, right knee: Secondary | ICD-10-CM | POA: Diagnosis not present

## 2015-06-07 DIAGNOSIS — M25561 Pain in right knee: Secondary | ICD-10-CM | POA: Diagnosis not present

## 2015-06-16 DIAGNOSIS — Z01419 Encounter for gynecological examination (general) (routine) without abnormal findings: Secondary | ICD-10-CM | POA: Diagnosis not present

## 2015-08-08 DIAGNOSIS — I10 Essential (primary) hypertension: Secondary | ICD-10-CM | POA: Diagnosis not present

## 2015-08-08 DIAGNOSIS — E119 Type 2 diabetes mellitus without complications: Secondary | ICD-10-CM | POA: Diagnosis not present

## 2015-08-08 DIAGNOSIS — E7800 Pure hypercholesterolemia, unspecified: Secondary | ICD-10-CM | POA: Diagnosis not present

## 2015-10-02 DIAGNOSIS — H04123 Dry eye syndrome of bilateral lacrimal glands: Secondary | ICD-10-CM | POA: Diagnosis not present

## 2015-11-17 DIAGNOSIS — Z0000 Encounter for general adult medical examination without abnormal findings: Secondary | ICD-10-CM | POA: Diagnosis not present

## 2015-11-17 DIAGNOSIS — Z23 Encounter for immunization: Secondary | ICD-10-CM | POA: Diagnosis not present

## 2015-11-23 DIAGNOSIS — K219 Gastro-esophageal reflux disease without esophagitis: Secondary | ICD-10-CM | POA: Diagnosis not present

## 2015-11-23 DIAGNOSIS — E669 Obesity, unspecified: Secondary | ICD-10-CM | POA: Diagnosis not present

## 2015-11-23 DIAGNOSIS — Z1211 Encounter for screening for malignant neoplasm of colon: Secondary | ICD-10-CM | POA: Diagnosis not present

## 2016-01-18 DIAGNOSIS — H04123 Dry eye syndrome of bilateral lacrimal glands: Secondary | ICD-10-CM | POA: Diagnosis not present

## 2016-01-29 DIAGNOSIS — K5900 Constipation, unspecified: Secondary | ICD-10-CM | POA: Diagnosis not present

## 2016-01-29 DIAGNOSIS — Z1211 Encounter for screening for malignant neoplasm of colon: Secondary | ICD-10-CM | POA: Diagnosis not present

## 2016-02-09 DIAGNOSIS — I10 Essential (primary) hypertension: Secondary | ICD-10-CM | POA: Diagnosis not present

## 2016-02-09 DIAGNOSIS — E7800 Pure hypercholesterolemia, unspecified: Secondary | ICD-10-CM | POA: Diagnosis not present

## 2016-02-09 DIAGNOSIS — E1122 Type 2 diabetes mellitus with diabetic chronic kidney disease: Secondary | ICD-10-CM | POA: Diagnosis not present

## 2016-02-09 DIAGNOSIS — N181 Chronic kidney disease, stage 1: Secondary | ICD-10-CM | POA: Diagnosis not present

## 2016-02-09 DIAGNOSIS — K219 Gastro-esophageal reflux disease without esophagitis: Secondary | ICD-10-CM | POA: Diagnosis not present

## 2016-02-09 DIAGNOSIS — Z7984 Long term (current) use of oral hypoglycemic drugs: Secondary | ICD-10-CM | POA: Diagnosis not present

## 2016-02-09 DIAGNOSIS — E6609 Other obesity due to excess calories: Secondary | ICD-10-CM | POA: Diagnosis not present

## 2016-02-09 DIAGNOSIS — F322 Major depressive disorder, single episode, severe without psychotic features: Secondary | ICD-10-CM | POA: Diagnosis not present

## 2016-02-26 DIAGNOSIS — H04123 Dry eye syndrome of bilateral lacrimal glands: Secondary | ICD-10-CM | POA: Diagnosis not present

## 2016-02-29 DIAGNOSIS — Z1231 Encounter for screening mammogram for malignant neoplasm of breast: Secondary | ICD-10-CM | POA: Diagnosis not present

## 2016-03-07 DIAGNOSIS — R928 Other abnormal and inconclusive findings on diagnostic imaging of breast: Secondary | ICD-10-CM | POA: Diagnosis not present

## 2016-03-07 DIAGNOSIS — R922 Inconclusive mammogram: Secondary | ICD-10-CM | POA: Diagnosis not present

## 2016-05-06 DIAGNOSIS — G4733 Obstructive sleep apnea (adult) (pediatric): Secondary | ICD-10-CM | POA: Diagnosis not present

## 2016-05-09 DIAGNOSIS — H04123 Dry eye syndrome of bilateral lacrimal glands: Secondary | ICD-10-CM | POA: Diagnosis not present

## 2016-07-23 DIAGNOSIS — E119 Type 2 diabetes mellitus without complications: Secondary | ICD-10-CM | POA: Diagnosis not present

## 2016-07-23 DIAGNOSIS — H40033 Anatomical narrow angle, bilateral: Secondary | ICD-10-CM | POA: Diagnosis not present

## 2016-08-02 DIAGNOSIS — E7800 Pure hypercholesterolemia, unspecified: Secondary | ICD-10-CM | POA: Diagnosis not present

## 2016-08-02 DIAGNOSIS — E1122 Type 2 diabetes mellitus with diabetic chronic kidney disease: Secondary | ICD-10-CM | POA: Diagnosis not present

## 2016-08-02 DIAGNOSIS — F322 Major depressive disorder, single episode, severe without psychotic features: Secondary | ICD-10-CM | POA: Diagnosis not present

## 2016-08-02 DIAGNOSIS — I10 Essential (primary) hypertension: Secondary | ICD-10-CM | POA: Diagnosis not present

## 2016-08-02 DIAGNOSIS — E6609 Other obesity due to excess calories: Secondary | ICD-10-CM | POA: Diagnosis not present

## 2016-08-02 DIAGNOSIS — N181 Chronic kidney disease, stage 1: Secondary | ICD-10-CM | POA: Diagnosis not present

## 2016-08-02 DIAGNOSIS — K219 Gastro-esophageal reflux disease without esophagitis: Secondary | ICD-10-CM | POA: Diagnosis not present

## 2016-08-17 ENCOUNTER — Emergency Department (HOSPITAL_COMMUNITY)
Admission: EM | Admit: 2016-08-17 | Discharge: 2016-08-17 | Disposition: A | Discharge status: Home / Self Care | Payer: Medicare Other | Attending: Emergency Medicine | Admitting: Emergency Medicine

## 2016-08-17 ENCOUNTER — Encounter (HOSPITAL_COMMUNITY): Payer: Self-pay | Admitting: Emergency Medicine

## 2016-08-17 DIAGNOSIS — E876 Hypokalemia: Secondary | ICD-10-CM | POA: Diagnosis not present

## 2016-08-17 DIAGNOSIS — R110 Nausea: Secondary | ICD-10-CM | POA: Diagnosis not present

## 2016-08-17 DIAGNOSIS — Z79899 Other long term (current) drug therapy: Secondary | ICD-10-CM | POA: Diagnosis not present

## 2016-08-17 DIAGNOSIS — Z7982 Long term (current) use of aspirin: Secondary | ICD-10-CM | POA: Diagnosis not present

## 2016-08-17 DIAGNOSIS — E119 Type 2 diabetes mellitus without complications: Secondary | ICD-10-CM | POA: Insufficient documentation

## 2016-08-17 DIAGNOSIS — I10 Essential (primary) hypertension: Secondary | ICD-10-CM | POA: Diagnosis not present

## 2016-08-17 DIAGNOSIS — Z7984 Long term (current) use of oral hypoglycemic drugs: Secondary | ICD-10-CM | POA: Insufficient documentation

## 2016-08-17 DIAGNOSIS — R109 Unspecified abdominal pain: Secondary | ICD-10-CM

## 2016-08-17 LAB — CBC
HCT: 38.2 % (ref 36.0–46.0)
Hemoglobin: 12.7 g/dL (ref 12.0–15.0)
MCH: 27.4 pg (ref 26.0–34.0)
MCHC: 33.2 g/dL (ref 30.0–36.0)
MCV: 82.3 fL (ref 78.0–100.0)
PLATELETS: 268 10*3/uL (ref 150–400)
RBC: 4.64 MIL/uL (ref 3.87–5.11)
RDW: 14.8 % (ref 11.5–15.5)
WBC: 8.5 10*3/uL (ref 4.0–10.5)

## 2016-08-17 LAB — COMPREHENSIVE METABOLIC PANEL
ALK PHOS: 71 U/L (ref 38–126)
ALT: 68 U/L — ABNORMAL HIGH (ref 14–54)
AST: 97 U/L — ABNORMAL HIGH (ref 15–41)
Albumin: 4.3 g/dL (ref 3.5–5.0)
Anion gap: 12 (ref 5–15)
BILIRUBIN TOTAL: 0.8 mg/dL (ref 0.3–1.2)
BUN: 10 mg/dL (ref 6–20)
CALCIUM: 9.7 mg/dL (ref 8.9–10.3)
CO2: 25 mmol/L (ref 22–32)
Chloride: 106 mmol/L (ref 101–111)
Creatinine, Ser: 0.75 mg/dL (ref 0.44–1.00)
GFR calc Af Amer: >60 mL/min (ref >60)
GFR calc non Af Amer: >60 mL/min (ref >60)
Glucose, Bld: 101 mg/dL — ABNORMAL HIGH (ref 65–99)
Potassium: 2.9 mmol/L — ABNORMAL LOW (ref 3.5–5.1)
SODIUM: 143 mmol/L (ref 135–145)
Total Protein: 7.7 g/dL (ref 6.5–8.1)

## 2016-08-17 LAB — URINALYSIS, ROUTINE W REFLEX MICROSCOPIC
Bilirubin Urine: NEGATIVE
Glucose, UA: NEGATIVE mg/dL
Ketones, ur: NEGATIVE mg/dL
Nitrite: NEGATIVE
PH: 6 (ref 5.0–8.0)
Protein, ur: NEGATIVE mg/dL
SPECIFIC GRAVITY, URINE: 1.005 (ref 1.005–1.030)

## 2016-08-17 LAB — CBG MONITORING, ED: GLUCOSE-CAPILLARY: 107 mg/dL — AB (ref 65–99)

## 2016-08-17 LAB — POC URINE PREG, ED: Preg Test, Ur: NEGATIVE

## 2016-08-17 MED ORDER — TRAMADOL HCL 50 MG PO TABS
100.0000 mg | ORAL_TABLET | Freq: Once | ORAL | Status: AC
  Administered 2016-08-17: 100 mg via ORAL
  Filled 2016-08-17: qty 2

## 2016-08-17 MED ORDER — SODIUM CHLORIDE 0.9 % IV SOLN
INTRAVENOUS | Status: DC
  Administered 2016-08-17: 15:00:00 via INTRAVENOUS

## 2016-08-17 MED ORDER — KETOROLAC TROMETHAMINE 30 MG/ML IJ SOLN
30.0000 mg | Freq: Once | INTRAMUSCULAR | Status: AC
  Administered 2016-08-17: 30 mg via INTRAVENOUS
  Filled 2016-08-17: qty 1

## 2016-08-17 MED ORDER — SODIUM CHLORIDE 0.9 % IV BOLUS (SEPSIS)
1000.0000 mL | Freq: Once | INTRAVENOUS | Status: AC
  Administered 2016-08-17: 1000 mL via INTRAVENOUS

## 2016-08-17 MED ORDER — ONDANSETRON HCL 4 MG/2ML IJ SOLN
4.0000 mg | Freq: Once | INTRAMUSCULAR | Status: AC
  Administered 2016-08-17: 4 mg via INTRAVENOUS
  Filled 2016-08-17: qty 2

## 2016-08-17 MED ORDER — METHOCARBAMOL 500 MG PO TABS: 500.0000 mg | ORAL_TABLET | Freq: Two times a day (BID) | ORAL | 0 refills | Status: DC

## 2016-08-17 MED ORDER — NAPROXEN 500 MG PO TABS: 500.0000 mg | ORAL_TABLET | Freq: Two times a day (BID) | ORAL | 0 refills | Status: DC

## 2016-08-17 MED ORDER — POTASSIUM CHLORIDE CRYS ER 20 MEQ PO TBCR
40.0000 meq | EXTENDED_RELEASE_TABLET | Freq: Once | ORAL | Status: AC
  Administered 2016-08-17: 40 meq via ORAL
  Filled 2016-08-17: qty 2

## 2016-08-17 NOTE — ED Provider Notes (Signed)
[facility] DEPT Provider Note   CSN: [id number] Arrival date & time: 08/17/16  1234     History   Chief Complaint Chief Complaint  Patient presents with  . Flank Pain  . Nausea    HPI Barbara [name] is a 51 y.o. female past medical history significant for diabetes, GERD, hypertension, hyperlipidemia who presents to the emergency department for 2 weeks of ongoing right flank pain with radiation into the right side. The patient states that she has had this pain in the past, starting approximately one year ago. She states that this normally lasts for a few weeks and then will gradually go away for a few months before returning. She states that the most recent episode started 2 weeks ago gradually while the patient was walking. She states that over the last 2 weeks the pain has gradually worsened to now a 10/10, constant sharp pain that is worsened by bending over, picking things up and walking. The patient points to her right lower back and right side when describing the location of her pain. There is no radiation into the abdomen itself or into the groin. The patient has tried taking Tylenol and ibuprofen for this with mild relief. The patient notes that approximately 3-4 days ago she started becoming nauseous as well. Due to this she stopped taking her diabetes medications over the last 2 days because she thinks that this is made her nausea worse. She does not monitor her blood sugars at home. She notes that she feels her blood pressure is high at home, and states that her highest reading with her home under has been 140/85. She has not missed any other medication. Last bowel movement yesterday morning and normal. Patient states she has been drinking at least 8 glasses of water per day and staying hydrated. Denies fever, emesis, dysuria, frequency, urgency, oliguria, hematuria, polydipsia, vaginal pain, vaginal bleeding, vaginal discharge, constipation, diarrhea, hematochezia, melena. Denies  history of cancer, trauma, fever, night pain, IV drug use, recent spinal manipulationor procedures, upper ack pain or neck pain, numbness/tingling/weakness of the lower extremities, urinary retention, loss of bowel/bladder function, saddle anesthesia, or unexplained weight loss. The patient does have a past abdominal surgical history including a partial hysterectomy in 2009 and laparoscopic lysis of adhesions in 2014.   HPI  Past Medical History:  Diagnosis Date  . Depression   . Diabetes mellitus without complication ([place])   . GERD (gastroesophageal reflux disease)   . Hypercholesteremia   . Hypertension   . Hypokalemia   . Low blood potassium   . Ovarian cyst   . Sleep apnea     Patient Active Problem List   Diagnosis Date Noted  . Hypertension   . Hypercholesteremia   . Depression   . Low blood potassium   . GERD (gastroesophageal reflux disease)     Past Surgical History:  Procedure Laterality Date  . EXAMINATION UNDER ANESTHESIA N/A 10/30/2012   Procedure: EXAM UNDER ANESTHESIA;  Surgeon: Delice Lesch, MD;  Location: [facility];  Service: Gynecology;  Laterality: N/A;  . LAPAROSCOPIC LYSIS OF ADHESIONS N/A 10/30/2012   Procedure: LAPAROSCOPIC LYSIS OF ADHESIONS;  Surgeon: Delice Lesch, MD;  Location: [facility];  Service: Gynecology;  Laterality: N/A;  . LAPAROSCOPY N/A 10/30/2012   Procedure: LAPAROSCOPY OPERATIVE;  Surgeon: Delice Lesch, MD;  Location: [facility];  Service: Gynecology;  Laterality: N/A;  . PARTIAL HYSTERECTOMY  2009    OB History    Gravida Para Term Preterm AB  Living   3 2     1 2    SAB TAB Ectopic Multiple Live Births                   Home Medications    Prior to Admission medications   Medication Sig Start Date End Date Taking? Authorizing Provider  acetaminophen (TYLENOL) 500 MG tablet Take 1,000 mg by mouth every 6 (six) hours as needed for mild pain or headache.    Yes [provider]  amLODipine (NORVASC) 10 MG tablet Take  10 mg by mouth every morning.   Yes [provider]  aspirin EC 81 MG tablet Take 81 mg by mouth daily.   Yes [provider]  calcium-vitamin D (OSCAL WITH D) 500-200 MG-UNIT per tablet Take 1 tablet by mouth 3 (three) times daily.    Yes [provider]  carboxymethylcellulose (REFRESH PLUS) 0.5 % SOLN Place 1 drop into both eyes 2 (two) times daily.    Yes [provider]  carvedilol (COREG) 12.5 MG tablet Take 12.5 mg by mouth 2 (two) times daily. 07/02/16  Yes [provider]  cetirizine (ZYRTEC) 10 MG tablet Take 10 mg by mouth every morning.   Yes [provider]  cycloSPORINE (RESTASIS) 0.05 % ophthalmic emulsion Place 1 drop into both eyes 2 (two) times daily.   Yes [provider]  furosemide (LASIX) 40 MG tablet Take 40 mg by mouth every morning.    Yes [provider]  irbesartan (AVAPRO) 300 MG tablet Take 300 mg by mouth daily. 08/02/16  Yes [provider]  metFORMIN (GLUCOPHAGE) 500 MG tablet Take 500 mg by mouth daily with breakfast.    Yes [provider]  omeprazole (PRILOSEC) 20 MG capsule Take 20 mg by mouth every morning.   Yes [provider]  potassium chloride SA (K-DUR,KLOR-CON) 20 MEQ tablet Take 20 mEq by mouth 2 (two) times daily.    Yes [provider]  rosuvastatin (CRESTOR) 20 MG tablet Take 20 mg by mouth at bedtime. 08/03/16  Yes [provider]  methocarbamol (ROBAXIN) 500 MG tablet Take 1 tablet (500 mg total) by mouth 2 (two) times daily. 08/17/16   Maczis, Barth Kirks, PA-C  naproxen (NAPROSYN) 500 MG tablet Take 1 tablet (500 mg total) by mouth 2 (two) times daily. 08/17/16   Maczis, Barth Kirks, PA-C    Family History Family History  Problem Relation Age of Onset  . Cancer Paternal Grandmother 31       bone  . Diabetes Paternal Grandmother   . Diabetes Father     Social History Social History  Substance Use Topics  . Smoking status: Never  Smoker  . Smokeless tobacco: Never Used  . Alcohol use No     Allergies   Lisinopril and Tomato flavor [flavoring agent]   Review of Systems Review of Systems  All other systems reviewed and are negative.    Physical Exam Updated Vital Signs BP 139/88 (BP Location: Left Arm)   Pulse 77   Temp 98.7 F (37.1 C) (Oral)   Resp 18   SpO2 99%   Physical Exam  Constitutional: She is oriented to person, place, and time. She appears well-developed and well-nourished. No distress.  Nontoxic appearing  HENT:  Head: Normocephalic and atraumatic.  Right Ear: External ear normal.  Left Ear: External ear normal.  Nose: Nose normal.  Mouth/Throat: Oropharynx is clear and moist.  Eyes: Pupils are equal, round, and reactive to  light. Right eye exhibits no discharge. Left eye exhibits no discharge. No scleral icterus.  Neck: Normal range of motion. Neck supple.  Cardiovascular: Normal rate, regular rhythm and intact distal pulses.   No murmur heard. Pulses:      Radial pulses are 2+ on the right side, and 2+ on the left side.       Dorsalis pedis pulses are 2+ on the right side, and 2+ on the left side.       Posterior tibial pulses are 2+ on the right side, and 2+ on the left side.  Pulmonary/Chest: Effort normal and breath sounds normal.  Abdominal: Soft. Bowel sounds are normal. There is no tenderness. There is no rebound, no guarding and no CVA tenderness.  Hysterectomy scar noted.  Musculoskeletal: She exhibits tenderness. She exhibits no edema.       Right hip: Normal.       Left hip: Normal.       Cervical back: Normal.       Thoracic back: Normal.  No C, T, or L spine tenderness to palpation. Tenderness palpation of the right lower back  Normal range of motion of the lumbar and thoracic spine. Patient notes reproduction of pain during flexion and extension of lower back. No obvious signs of trauma, deformity, infection, step-offs. Lung expansion normal. No scoliosis or  kyphosis. Bilateral lower extremity strength 5 out of 5, sensation grossly intact. Straight leg negative. Ambulates appropriately but states painful.   Lymphadenopathy:    She has no cervical adenopathy.  Neurological: She is alert and oriented to person, place, and time. She has normal strength. No sensory deficit. She displays a negative Romberg sign.  Speech clear. Follows commands. No facial droop. PERRLA. EOMI. Normal peripheral fields. CN III-XII intact.  Grossly moves all extremities 4 without ataxia. Coordination intact. Able and appropriate strength for age to upper and lower extremities bilaterally including grip strength. Sensation to light touch intact bilaterally for upper and lower. Patellar deep tendon reflex 2+ and equal bilaterally. Normal finger to nose and rapid alternating movements. Normal heel to shin balance. Negative Romberg. No pronator drift. Normal gait.   Skin: Skin is warm, dry and intact. No rash noted. She is not diaphoretic. No erythema.  Psychiatric: She has a normal mood and affect. Her behavior is normal. Judgment and thought content normal.  Nursing note and vitals reviewed.    ED Treatments / Results  Labs (all labs ordered are listed, but only abnormal results are displayed) Labs Reviewed  URINALYSIS, ROUTINE W REFLEX MICROSCOPIC - Abnormal; Notable for the following:       Result Value   APPearance CLOUDY (*)    Hgb urine dipstick SMALL (*)    Leukocytes, UA MODERATE (*)    Bacteria, UA FEW (*)    Squamous Epithelial / LPF TOO NUMEROUS TO COUNT (*)    All other components within normal limits  COMPREHENSIVE METABOLIC PANEL - Abnormal; Notable for the following:    Potassium 2.9 (*)    Glucose, Bld 101 (*)    AST 97 (*)    ALT 68 (*)    All other components within normal limits  CBG MONITORING, ED - Abnormal; Notable for the following:    Glucose-Capillary 107 (*)    All other components within normal limits  CBC  POC URINE PREG, ED     EKG  EKG Interpretation None       Radiology No results found.  Procedures Procedures (including critical care  time)  Medications Ordered in ED Medications  sodium chloride 0.9 % bolus 1,000 mL (0 mLs Intravenous Stopped 08/17/16 1440)    And  0.9 %  sodium chloride infusion ( Intravenous Stopped 08/17/16 1646)  ketorolac (TORADOL) 30 MG/ML injection 30 mg (30 mg Intravenous Given 08/17/16 1345)  ondansetron (ZOFRAN) injection 4 mg (4 mg Intravenous Given 08/17/16 1345)  potassium chloride SA (K-DUR,KLOR-CON) CR tablet 40 mEq (40 mEq Oral Given 08/17/16 1506)  traMADol (ULTRAM) tablet 100 mg (100 mg Oral Given 08/17/16 1533)     Initial Impression / Assessment and Plan / ED Course  I have reviewed the triage vital signs and the nursing notes.  Pertinent labs & imaging results that were available during my care of the patient were reviewed by me and considered in my medical decision making (see chart for details).     51 year old female with right flank pain with associated nausea. This has been on/off for 1 year with most recent episode beginning 2 weeks ago. She's never been evaluated for this prior to this. This is not associated with any urinary symptoms or abdominal pain. On exam the patient's vital signs are within normal limits reassuring. She is nontoxic-appearing. CBG 107. Urinalysis was not a clean catch, and as the patient is without urinary symptoms do not believe her leukocytosis and bacteriuria are consistent with a UTI. CBC without leukocytosis and otherwise unremarkable. CMP with hypokalemia at 2.9 noted. Replaced orally. No signs of DKA (glucose 101, no anion gap acidosis). Patient has missed diabetic medication over the last 2 days. I will advise the patient to continue her medication at home as normally scheduled. Patient pain and nausea controlled in the emergency department.   The evaluation does not show pathology that would require ongoing emergent intervention  or inpatient treatment. I suspect that some her pain may be MSK related due to the pain with ROM and walking. I will try the patient on a trail of conservative treatment for this. I advised the patient to follow-up with their primary care provider this week. I advised the patient to return to the emergency department with new or worsening symptoms or new concerns. Specific return precautions discussed. The patient verbalized understanding and agreement with plan. All questions answered. No further questions at this time. The patient is hemodynamically stable, mentating appropriately and appears safe for discharge.  Patient case discussed with Dr. Eulis Foster who is in agreement with plan.   Final Clinical Impressions(s) / ED Diagnoses   Final diagnoses:  Right flank pain  Hypokalemia    New Prescriptions Discharge Medication List as of 08/17/2016  4:42 PM    START taking these medications   Details  methocarbamol (ROBAXIN) 500 MG tablet Take 1 tablet (500 mg total) by mouth 2 (two) times daily., Starting Sat 08/17/2016, Print    naproxen (NAPROSYN) 500 MG tablet Take 1 tablet (500 mg total) by mouth 2 (two) times daily., Starting Sat 08/17/2016, Print         [place], Barth Kirks, PA-C 08/17/16 1933    Jillyn Ledger, PA-C 08/17/16 1944    Daleen Bo, MD 08/19/16 1332

## 2016-08-17 NOTE — Discharge Instructions (Addendum)
Your lab tests were reassuring. Your blood levels did show low potassium. Please double up on your current potassium regimen for the next 5 days. Please follow up with your PCP in regards to your elevated blood pressure and todays visit.  Your symptoms may be due to back Pain: Your back pain should be treated with medicines such as ibuprofen or aleve and this back pain should get better over the next 2 weeks.  However if you develop severe or worsening pain, low back pain with fever, numbness, weakness or inability to walk or urinate, you should return to the ER immediately.  Please follow up with your doctor this week for a recheck if still having symptoms. Low back pain is discomfort in the lower back that may be due to injuries to muscles and ligaments around the spine.  Occasionally, it may be caused by a a problem to a part of the spine called a disc.  The pain may last several days or a week;  However, most patients get completely well in 4 weeks.  Self - care:  The application of heat can help soothe the pain.  Maintaining your daily activities, including walking, is encourged, as it will help you get better faster than just staying in bed.  Medications are also useful to help with pain control.  A commonly prescribed medications includes acetaminophen.  This medication is generally safe, though you should not take more than 8 of the extra strength (500mg ) pills a day.  Non steroidal anti inflammatory medications including Ibuprofen and naproxen;  These medications help both pain and swelling and are very useful in treating back pain.  They should be taken with food, as they can cause stomach upset, and more seriously, stomach bleeding.    Muscle relaxants:  These medications can help with muscle tightness that is a cause of lower back pain.  Most of these medications can cause drowsiness, and it is not safe to drive or use dangerous machinery while taking them.  You will need to follow up with   Your primary healthcare provider in 1-2 weeks for reassessment.  Be aware that if you develop new symptoms, such as a fever, leg weakness, difficulty with or loss of control of your urine or bowels, abdominal pain, or more severe pain, you will need to seek medical attention and  / or return to the Emergency department.  If you do not have a doctor see the list below.

## 2016-08-17 NOTE — ED Triage Notes (Signed)
Patient here from home with complaints of right flank pain radiating into abdomen. Nausea no vomiting. Denies urinary symptoms.

## 2016-08-21 DIAGNOSIS — I10 Essential (primary) hypertension: Secondary | ICD-10-CM | POA: Diagnosis not present

## 2016-08-21 DIAGNOSIS — D179 Benign lipomatous neoplasm, unspecified: Secondary | ICD-10-CM | POA: Diagnosis not present

## 2016-08-21 DIAGNOSIS — R109 Unspecified abdominal pain: Secondary | ICD-10-CM | POA: Diagnosis not present

## 2016-08-21 DIAGNOSIS — E1122 Type 2 diabetes mellitus with diabetic chronic kidney disease: Secondary | ICD-10-CM | POA: Diagnosis not present

## 2016-09-09 DIAGNOSIS — H04123 Dry eye syndrome of bilateral lacrimal glands: Secondary | ICD-10-CM | POA: Diagnosis not present

## 2016-11-12 DIAGNOSIS — H04123 Dry eye syndrome of bilateral lacrimal glands: Secondary | ICD-10-CM | POA: Diagnosis not present

## 2016-11-13 DIAGNOSIS — H20013 Primary iridocyclitis, bilateral: Secondary | ICD-10-CM | POA: Diagnosis not present

## 2016-11-20 DIAGNOSIS — Z0000 Encounter for general adult medical examination without abnormal findings: Secondary | ICD-10-CM | POA: Diagnosis not present

## 2016-11-21 DIAGNOSIS — H20013 Primary iridocyclitis, bilateral: Secondary | ICD-10-CM | POA: Diagnosis not present

## 2016-11-25 DIAGNOSIS — M25551 Pain in right hip: Secondary | ICD-10-CM | POA: Diagnosis not present

## 2016-11-25 DIAGNOSIS — M25561 Pain in right knee: Secondary | ICD-10-CM | POA: Diagnosis not present

## 2016-11-25 DIAGNOSIS — M545 Low back pain: Secondary | ICD-10-CM | POA: Diagnosis not present

## 2017-01-23 DIAGNOSIS — H04123 Dry eye syndrome of bilateral lacrimal glands: Secondary | ICD-10-CM | POA: Diagnosis not present

## 2017-02-05 DIAGNOSIS — N181 Chronic kidney disease, stage 1: Secondary | ICD-10-CM | POA: Diagnosis not present

## 2017-02-05 DIAGNOSIS — E1122 Type 2 diabetes mellitus with diabetic chronic kidney disease: Secondary | ICD-10-CM | POA: Diagnosis not present

## 2017-02-05 DIAGNOSIS — Z7984 Long term (current) use of oral hypoglycemic drugs: Secondary | ICD-10-CM | POA: Diagnosis not present

## 2017-02-05 DIAGNOSIS — E6609 Other obesity due to excess calories: Secondary | ICD-10-CM | POA: Diagnosis not present

## 2017-02-05 DIAGNOSIS — E7800 Pure hypercholesterolemia, unspecified: Secondary | ICD-10-CM | POA: Diagnosis not present

## 2017-02-05 DIAGNOSIS — Z6834 Body mass index (BMI) 34.0-34.9, adult: Secondary | ICD-10-CM | POA: Diagnosis not present

## 2017-02-05 DIAGNOSIS — K219 Gastro-esophageal reflux disease without esophagitis: Secondary | ICD-10-CM | POA: Diagnosis not present

## 2017-02-05 DIAGNOSIS — I10 Essential (primary) hypertension: Secondary | ICD-10-CM | POA: Diagnosis not present

## 2017-03-10 DIAGNOSIS — Z1231 Encounter for screening mammogram for malignant neoplasm of breast: Secondary | ICD-10-CM | POA: Diagnosis not present

## 2017-06-09 DIAGNOSIS — G4733 Obstructive sleep apnea (adult) (pediatric): Secondary | ICD-10-CM | POA: Diagnosis not present

## 2017-06-17 DIAGNOSIS — Z01419 Encounter for gynecological examination (general) (routine) without abnormal findings: Secondary | ICD-10-CM | POA: Diagnosis not present

## 2017-08-05 DIAGNOSIS — I10 Essential (primary) hypertension: Secondary | ICD-10-CM | POA: Diagnosis not present

## 2017-08-05 DIAGNOSIS — N181 Chronic kidney disease, stage 1: Secondary | ICD-10-CM | POA: Diagnosis not present

## 2017-08-05 DIAGNOSIS — E6609 Other obesity due to excess calories: Secondary | ICD-10-CM | POA: Diagnosis not present

## 2017-08-05 DIAGNOSIS — E1122 Type 2 diabetes mellitus with diabetic chronic kidney disease: Secondary | ICD-10-CM | POA: Diagnosis not present

## 2017-08-05 DIAGNOSIS — E7800 Pure hypercholesterolemia, unspecified: Secondary | ICD-10-CM | POA: Diagnosis not present

## 2017-10-06 DIAGNOSIS — H10013 Acute follicular conjunctivitis, bilateral: Secondary | ICD-10-CM | POA: Diagnosis not present

## 2017-12-02 ENCOUNTER — Encounter (HOSPITAL_COMMUNITY): Payer: Self-pay

## 2017-12-02 ENCOUNTER — Emergency Department (HOSPITAL_COMMUNITY)
Admission: EM | Admit: 2017-12-02 | Discharge: 2017-12-03 | Disposition: A | Discharge status: Home / Self Care | Payer: Medicare Other | Attending: Emergency Medicine | Admitting: Emergency Medicine

## 2017-12-02 ENCOUNTER — Other Ambulatory Visit: Payer: Self-pay

## 2017-12-02 ENCOUNTER — Emergency Department (HOSPITAL_COMMUNITY): Payer: Medicare Other

## 2017-12-02 DIAGNOSIS — N390 Urinary tract infection, site not specified: Secondary | ICD-10-CM | POA: Diagnosis not present

## 2017-12-02 DIAGNOSIS — F329 Major depressive disorder, single episode, unspecified: Secondary | ICD-10-CM | POA: Insufficient documentation

## 2017-12-02 DIAGNOSIS — I10 Essential (primary) hypertension: Secondary | ICD-10-CM | POA: Diagnosis not present

## 2017-12-02 DIAGNOSIS — E876 Hypokalemia: Secondary | ICD-10-CM | POA: Diagnosis not present

## 2017-12-02 DIAGNOSIS — R1031 Right lower quadrant pain: Secondary | ICD-10-CM | POA: Insufficient documentation

## 2017-12-02 DIAGNOSIS — Z7984 Long term (current) use of oral hypoglycemic drugs: Secondary | ICD-10-CM | POA: Insufficient documentation

## 2017-12-02 DIAGNOSIS — Z7982 Long term (current) use of aspirin: Secondary | ICD-10-CM | POA: Diagnosis not present

## 2017-12-02 DIAGNOSIS — Z79899 Other long term (current) drug therapy: Secondary | ICD-10-CM | POA: Diagnosis not present

## 2017-12-02 DIAGNOSIS — E119 Type 2 diabetes mellitus without complications: Secondary | ICD-10-CM | POA: Diagnosis not present

## 2017-12-02 DIAGNOSIS — R160 Hepatomegaly, not elsewhere classified: Secondary | ICD-10-CM | POA: Diagnosis not present

## 2017-12-02 MED ORDER — SODIUM CHLORIDE 0.9 % IV BOLUS: 1000.0000 mL | Freq: Once | INTRAVENOUS | Status: DC

## 2017-12-02 NOTE — ED Provider Notes (Signed)
[facility] DEPT Provider Note   CSN: [id number] Arrival date & time: 12/02/17  1607     History   Chief Complaint Chief Complaint  Patient presents with  . Leg Pain  . Hypertension    HPI Barbara [name] is a 52 y.o. female.  HPI   She presents for evaluation of high blood pressure checked at home, in the range of 160/110.  She continues to take her usual medicines.  Last medication adjustment June 2019, when she last saw her PCP.  She is also treated for diabetes.  She reports ongoing right flank pain radiating to the right mid abdomen, present for 1 year.  Pain comes and goes but has been more persistent for the last month.  Denies dysuria, urinary frequency, nausea, vomiting, fever, chills, cough or chest pain.  There are no other known modifying factors.  Past Medical History:  Diagnosis Date  . Depression   . Diabetes mellitus without complication ([place])   . GERD (gastroesophageal reflux disease)   . Hypercholesteremia   . Hypertension   . Hypokalemia   . Low blood potassium   . Ovarian cyst   . Sleep apnea     Patient Active Problem List   Diagnosis Date Noted  . Hypertension   . Hypercholesteremia   . Depression   . Low blood potassium   . GERD (gastroesophageal reflux disease)     Past Surgical History:  Procedure Laterality Date  . EXAMINATION UNDER ANESTHESIA N/A 10/30/2012   Procedure: EXAM UNDER ANESTHESIA;  Surgeon: Delice Lesch, MD;  Location: [facility];  Service: Gynecology;  Laterality: N/A;  . LAPAROSCOPIC LYSIS OF ADHESIONS N/A 10/30/2012   Procedure: LAPAROSCOPIC LYSIS OF ADHESIONS;  Surgeon: Delice Lesch, MD;  Location: [facility];  Service: Gynecology;  Laterality: N/A;  . LAPAROSCOPY N/A 10/30/2012   Procedure: LAPAROSCOPY OPERATIVE;  Surgeon: Delice Lesch, MD;  Location: [facility];  Service: Gynecology;  Laterality: N/A;  . PARTIAL HYSTERECTOMY  2009     OB History    Gravida  3   Para  2   Term      Preterm      AB  1   Living  2     SAB      TAB      Ectopic      Multiple      Live Births               Home Medications    Prior to Admission medications   Medication Sig Start Date End Date Taking? Authorizing Provider  acetaminophen (TYLENOL) 500 MG tablet Take 1,000 mg by mouth every 6 (six) hours as needed for mild pain or headache.    Yes [provider]  amLODipine (NORVASC) 10 MG tablet Take 10 mg by mouth every morning.   Yes [provider]  aspirin EC 81 MG tablet Take 81 mg by mouth daily.   Yes [provider]  carvedilol (COREG) 12.5 MG tablet Take 12.5 mg by mouth 2 (two) times daily. 07/02/16  Yes [provider]  cetirizine (ZYRTEC) 10 MG tablet Take 10 mg by mouth every morning.   Yes [provider]  cycloSPORINE (RESTASIS) 0.05 % ophthalmic emulsion Place 1 drop into both eyes 2 (two) times daily.   Yes [provider]  furosemide (LASIX) 40 MG tablet Take 40 mg by mouth every morning.    Yes [provider]  Garlic 6568 MG  CAPS Take 1,000 mg by mouth daily.   Yes [provider]  metFORMIN (GLUCOPHAGE) 1000 MG tablet Take 1,000 mg by mouth daily.   Yes [provider]  omeprazole (PRILOSEC) 20 MG capsule Take 20 mg by mouth daily as needed (acid reflux).    Yes [provider]  potassium chloride SA (K-DUR,KLOR-CON) 20 MEQ tablet Take 20 mEq by mouth 2 (two) times daily.    Yes [provider]  rosuvastatin (CRESTOR) 20 MG tablet Take 20 mg by mouth at bedtime. 08/03/16  Yes [provider]  telmisartan (MICARDIS) 80 MG tablet Take 80 mg by mouth daily. 10/11/17  Yes [provider]  methocarbamol (ROBAXIN) 500 MG tablet Take 1 tablet (500 mg total) by mouth 2 (two) times daily. Patient not taking: Reported on 12/02/2017 08/17/16   Maczis, Barth Kirks, PA-C  naproxen (NAPROSYN) 500 MG tablet Take 1 tablet (500 mg total) by mouth 2 (two)  times daily. Patient not taking: Reported on 12/02/2017 08/17/16   Jillyn Ledger, PA-C    Family History Family History  Problem Relation Age of Onset  . Cancer Paternal Grandmother 40       bone  . Diabetes Paternal Grandmother   . Diabetes Father     Social History Social History   Tobacco Use  . Smoking status: Never Smoker  . Smokeless tobacco: Never Used  Substance Use Topics  . Alcohol use: No  . Drug use: No     Allergies   Lisinopril and Tomato flavor [flavoring agent]   Review of Systems Review of Systems  All other systems reviewed and are negative.    Physical Exam Updated Vital Signs BP (!) 144/109   Pulse 65   Temp 98.6 F (37 C) (Oral)   Resp 18   SpO2 98%   Physical Exam  Constitutional: She is oriented to person, place, and time. She appears well-developed and well-nourished.  HENT:  Head: Normocephalic and atraumatic.  Eyes: Pupils are equal, round, and reactive to light. Conjunctivae and EOM are normal.  Neck: Normal range of motion and phonation normal. Neck supple.  Cardiovascular: Normal rate and regular rhythm.  Pulmonary/Chest: Effort normal and breath sounds normal. She exhibits no tenderness.  Abdominal: Soft. She exhibits no distension. There is no tenderness. There is no guarding.  Genitourinary:  Genitourinary Comments: No costovertebral angle tenderness.  Musculoskeletal: Normal range of motion. She exhibits no edema or deformity.  Neurological: She is alert and oriented to person, place, and time. She exhibits normal muscle tone.  Skin: Skin is warm and dry.  Psychiatric: She has a normal mood and affect. Her behavior is normal. Judgment and thought content normal.  Nursing note and vitals reviewed.    ED Treatments / Results  Labs (all labs ordered are listed, but only abnormal results are displayed) Labs Reviewed  URINALYSIS, ROUTINE W REFLEX MICROSCOPIC - Abnormal; Notable for the following components:      Result  Value   APPearance CLOUDY (*)    Leukocytes, UA LARGE (*)    Bacteria, UA FEW (*)    All other components within normal limits  I-STAT CHEM 8, ED - Abnormal; Notable for the following components:   Potassium 3.0 (*)    Glucose, Bld 105 (*)    Hemoglobin 11.9 (*)    HCT 35.0 (*)    All other components within normal limits  RAPID URINE DRUG SCREEN, HOSP PERFORMED  CBC WITH DIFFERENTIAL/PLATELET    EKG None  Radiology Ct Renal Stone Study  Result Date: 12/02/2017 CLINICAL DATA:  Bilateral leg, groin, shoulder and upper back pain for 3 weeks. History of laparoscopic surgery October 2014, hysterectomy. EXAM: CT ABDOMEN AND PELVIS WITHOUT CONTRAST TECHNIQUE: Multidetector CT imaging of the abdomen and pelvis was performed following the standard protocol without IV contrast. COMPARISON:  Pelvic MRI April 09, 2013 FINDINGS: LOWER CHEST: Mild dependent atelectasis. The visualized heart size is normal. No pericardial effusion. HEPATOBILIARY: Mild hepatomegaly. Normal gallbladder. PANCREAS: Normal. SPLEEN: Normal. ADRENALS/URINARY TRACT: Kidneys are orthotopic, demonstrating normal size and morphology. No nephrolithiasis, hydronephrosis; limited assessment for renal masses by nonenhanced CT. 12 mm hypodense RIGHT interpolar cyst. The unopacified ureters are normal in course and caliber. Urinary bladder is well distended and unremarkable. Normal adrenal glands. STOMACH/BOWEL: The stomach, small and large bowel are normal in course and caliber without inflammatory changes, sensitivity decreased by lack of enteric contrast. Normal appendix. VASCULAR/LYMPHATIC: Aortoiliac vessels are normal in course and caliber. No lymphadenopathy by CT size criteria. REPRODUCTIVE: Status post hysterectomy. OTHER: No intraperitoneal free fluid or free air. MUSCULOSKELETAL: Non-acute. Anterior abdominal wall scarring. Moderate sacroiliac osteoarthrosis. No advanced degenerative changes lumbar spine. IMPRESSION: 1. No  urolithiasis, obstructive uropathy or acute intra-abdominal/pelvic process. 2. Mild hepatomegaly. Electronically Signed   By: Elon Alas M.D.   On: 12/02/2017 23:33    Procedures Procedures (including critical care time)  Medications Ordered in ED Medications  sodium chloride 0.9 % bolus 1,000 mL (has no administration in time range)  potassium chloride SA (K-DUR,KLOR-CON) CR tablet 40 mEq (has no administration in time range)     Initial Impression / Assessment and Plan / ED Course  I have reviewed the triage vital signs and the nursing notes.  Pertinent labs & imaging results that were available during my care of the patient were reviewed by me and considered in my medical decision making (see chart for details).  Clinical Course as of Dec 04 22  Wed Dec 03, 2017  0020 Normal except potassium low, glucose high, hemoglobin low  I-stat Chem 8, ED(!) [EW]  0021 Normal except leukocytes elevated, RBCs high, WBC high, few bacteria.  Urine culture ordered  Urinalysis, Routine w reflex microscopic(!) [EW]  0021 Normal  Urine rapid drug screen (hosp performed) [EW]  0022 No hydronephrosis or acute intra-abdominal abnormalities, images reviewed by me  CT Renal Laren Everts [EW]    Clinical Course User Index [EW] Daleen Bo, MD     Patient Vitals for the past 24 hrs:  BP Temp Temp src Pulse Resp SpO2  12/02/17 2236 (!) 144/109 - - 65 18 98 %  12/02/17 2203 (!) 154/110 - - 76 - 96 %  12/02/17 1934 (!) 138/96 - - 91 14 95 %  12/02/17 1625 (!) 141/99 98.6 F (37 C) Oral 84 16 97 %    12:23 AM Reevaluation with update and discussion. After initial assessment and treatment, an updated evaluation reveals a pressure improved spontaneously.Daleen Bo   Medical Decision Making: Nonspecific flank pain, with UTI.  Doubt severe sepsis or metabolic instability.  Incidental mild hypokalemia, patient being treated with furosemide and has prescription for potassium.  CRITICAL  CARE-no Performed by: Daleen Bo  Nursing Notes Reviewed/ Care Coordinated Applicable Imaging Reviewed Interpretation of Laboratory Data incorporated into ED treatment     Plan disposition per oncoming provider team, return of labs.   Final Clinical Impressions(s) / ED Diagnoses   Final diagnoses:  Hypertension, unspecified type  Urinary tract infection without hematuria,  site unspecified  Hypokalemia    ED Discharge Orders    None       Daleen Bo, MD 12/03/17 0025

## 2017-12-02 NOTE — ED Triage Notes (Signed)
Pt reports bilateral leg pain that starts in her groin and bilateral shoulder and upper back pain x 3 weeks. Pt also endorses hypertension despite taking her medication for the last 3 days. A&Ox4.

## 2017-12-03 DIAGNOSIS — I10 Essential (primary) hypertension: Secondary | ICD-10-CM | POA: Diagnosis not present

## 2017-12-03 LAB — I-STAT CHEM 8, ED
BUN: 13 mg/dL (ref 6–20)
CALCIUM ION: 1.18 mmol/L (ref 1.15–1.40)
Chloride: 107 mmol/L (ref 98–111)
Creatinine, Ser: 0.8 mg/dL (ref 0.44–1.00)
GLUCOSE: 105 mg/dL — AB (ref 70–99)
HCT: 35 % — ABNORMAL LOW (ref 36.0–46.0)
HEMOGLOBIN: 11.9 g/dL — AB (ref 12.0–15.0)
Potassium: 3 mmol/L — ABNORMAL LOW (ref 3.5–5.1)
Sodium: 142 mmol/L (ref 135–145)
TCO2: 25 mmol/L (ref 22–32)

## 2017-12-03 LAB — CBC WITH DIFFERENTIAL/PLATELET
ABS IMMATURE GRANULOCYTES: 0.03 10*3/uL (ref 0.00–0.07)
Basophils Absolute: 0.1 10*3/uL (ref 0.0–0.1)
Basophils Relative: 1 %
EOS ABS: 0.4 10*3/uL (ref 0.0–0.5)
Eosinophils Relative: 4 %
HEMATOCRIT: 39 % (ref 36.0–46.0)
Hemoglobin: 11.9 g/dL — ABNORMAL LOW (ref 12.0–15.0)
Immature Granulocytes: 0 %
LYMPHS ABS: 4.6 10*3/uL — AB (ref 0.7–4.0)
Lymphocytes Relative: 43 %
MCH: 26.5 pg (ref 26.0–34.0)
MCHC: 30.5 g/dL (ref 30.0–36.0)
MCV: 86.9 fL (ref 80.0–100.0)
MONOS PCT: 9 %
Monocytes Absolute: 0.9 10*3/uL (ref 0.1–1.0)
NEUTROS ABS: 4.6 10*3/uL (ref 1.7–7.7)
NRBC: 0 % (ref 0.0–0.2)
Neutrophils Relative %: 43 %
Platelets: 236 10*3/uL (ref 150–400)
RBC: 4.49 MIL/uL (ref 3.87–5.11)
RDW: 15 % (ref 11.5–15.5)
WBC: 10.5 10*3/uL (ref 4.0–10.5)

## 2017-12-03 LAB — URINALYSIS, ROUTINE W REFLEX MICROSCOPIC
BILIRUBIN URINE: NEGATIVE
Glucose, UA: NEGATIVE mg/dL
Hgb urine dipstick: NEGATIVE
KETONES UR: NEGATIVE mg/dL
Nitrite: NEGATIVE
Protein, ur: NEGATIVE mg/dL
Specific Gravity, Urine: 1.014 (ref 1.005–1.030)
pH: 5 (ref 5.0–8.0)

## 2017-12-03 LAB — RAPID URINE DRUG SCREEN, HOSP PERFORMED
Amphetamines: NOT DETECTED
Barbiturates: NOT DETECTED
Benzodiazepines: NOT DETECTED
Cocaine: NOT DETECTED
OPIATES: NOT DETECTED
Tetrahydrocannabinol: NOT DETECTED

## 2017-12-03 MED ORDER — POTASSIUM CHLORIDE CRYS ER 20 MEQ PO TBCR
40.0000 meq | EXTENDED_RELEASE_TABLET | Freq: Once | ORAL | Status: AC
  Administered 2017-12-03: 40 meq via ORAL
  Filled 2017-12-03: qty 2

## 2017-12-03 MED ORDER — CEPHALEXIN 500 MG PO CAPS: 500.0000 mg | ORAL_CAPSULE | Freq: Two times a day (BID) | ORAL | 0 refills | Status: DC

## 2017-12-03 MED ORDER — FLUCONAZOLE 150 MG PO TABS: 150.0000 mg | ORAL_TABLET | Freq: Once | ORAL | 1 refills | Status: AC

## 2017-12-03 NOTE — ED Provider Notes (Signed)
Patient signed out to me to follow-up on blood work.  Patient has been seen with multiple complaints by Dr. Eulis Foster.  Urinalysis suggest infection, will discharge with prescription for Keflex.  She had slight hypokalemia, was given potassium here in the ER.  Remainder of blood work and lab work unremarkable.   Orpah Greek, MD 12/03/17 0130

## 2017-12-03 NOTE — ED Notes (Signed)
NSR monitored.  Continuous cardiac monitor in place.

## 2017-12-03 NOTE — ED Notes (Signed)
Pt declined IV and IVF.  Pt tolerating PO fluids without difficulty.

## 2017-12-05 DIAGNOSIS — I10 Essential (primary) hypertension: Secondary | ICD-10-CM | POA: Diagnosis not present

## 2017-12-05 DIAGNOSIS — E1122 Type 2 diabetes mellitus with diabetic chronic kidney disease: Secondary | ICD-10-CM | POA: Diagnosis not present

## 2017-12-05 DIAGNOSIS — N390 Urinary tract infection, site not specified: Secondary | ICD-10-CM | POA: Diagnosis not present

## 2018-01-26 DIAGNOSIS — G4733 Obstructive sleep apnea (adult) (pediatric): Secondary | ICD-10-CM | POA: Diagnosis not present

## 2018-02-10 DIAGNOSIS — G4733 Obstructive sleep apnea (adult) (pediatric): Secondary | ICD-10-CM | POA: Diagnosis not present

## 2018-02-10 DIAGNOSIS — E1122 Type 2 diabetes mellitus with diabetic chronic kidney disease: Secondary | ICD-10-CM | POA: Diagnosis not present

## 2018-02-10 DIAGNOSIS — I10 Essential (primary) hypertension: Secondary | ICD-10-CM | POA: Diagnosis not present

## 2018-02-10 DIAGNOSIS — E6609 Other obesity due to excess calories: Secondary | ICD-10-CM | POA: Diagnosis not present

## 2018-02-10 DIAGNOSIS — E7800 Pure hypercholesterolemia, unspecified: Secondary | ICD-10-CM | POA: Diagnosis not present

## 2018-02-10 DIAGNOSIS — M79671 Pain in right foot: Secondary | ICD-10-CM | POA: Diagnosis not present

## 2018-02-10 DIAGNOSIS — F322 Major depressive disorder, single episode, severe without psychotic features: Secondary | ICD-10-CM | POA: Diagnosis not present

## 2018-02-10 DIAGNOSIS — N181 Chronic kidney disease, stage 1: Secondary | ICD-10-CM | POA: Diagnosis not present

## 2018-02-10 DIAGNOSIS — J309 Allergic rhinitis, unspecified: Secondary | ICD-10-CM | POA: Diagnosis not present

## 2018-02-10 DIAGNOSIS — Z0000 Encounter for general adult medical examination without abnormal findings: Secondary | ICD-10-CM | POA: Diagnosis not present

## 2018-02-23 ENCOUNTER — Encounter: Payer: Self-pay | Admitting: Podiatry

## 2018-02-23 ENCOUNTER — Ambulatory Visit (INDEPENDENT_AMBULATORY_CARE_PROVIDER_SITE_OTHER): Payer: Medicare Other | Admitting: Podiatry

## 2018-02-23 VITALS — BP 138/79

## 2018-02-23 DIAGNOSIS — E0843 Diabetes mellitus due to underlying condition with diabetic autonomic (poly)neuropathy: Secondary | ICD-10-CM | POA: Diagnosis not present

## 2018-02-23 DIAGNOSIS — M722 Plantar fascial fibromatosis: Secondary | ICD-10-CM

## 2018-02-23 MED ORDER — MELOXICAM 15 MG PO TABS: 15.0000 mg | ORAL_TABLET | Freq: Every day | ORAL | 1 refills | Status: AC

## 2018-02-23 MED ORDER — GABAPENTIN 100 MG PO CAPS: 100.0000 mg | ORAL_CAPSULE | Freq: Two times a day (BID) | ORAL | 1 refills | Status: DC

## 2018-02-26 NOTE — Progress Notes (Signed)
   Subjective: 53 year old female with PMHx of DM presenting today with a chief complaint of intermittent aching, burning pain to the plantar aspects of the bilateral feet that began 6 months ago. Walking increases the pain. She has not done anything for treatment. Patient is here for further evaluation and treatment.   Past Medical History:  Diagnosis Date  . Depression   . Diabetes mellitus without complication ([place])   . GERD (gastroesophageal reflux disease)   . Hypercholesteremia   . Hypertension   . Hypokalemia   . Low blood potassium   . Ovarian cyst   . Sleep apnea      Objective: Physical Exam General: The patient is alert and oriented x3 in no acute distress.  Dermatology: Skin is warm, dry and supple bilateral lower extremities. Negative for open lesions or macerations bilateral.   Vascular: Dorsalis Pedis and Posterior Tibial pulses palpable bilateral.  Capillary fill time is immediate to all digits.  Neurological: Epicritic and protective threshold diminished bilateral.   Musculoskeletal: Tenderness to palpation to the plantar aspect of the bilateral heels along the plantar fascia. All other joints range of motion within normal limits bilateral. Strength 5/5 in all groups bilateral.   Assessment: 1. plantar fasciitis bilateral feet 2. DM with peripheral neuropathy   Plan of Care:  1. Patient evaluated.    2. Injection of 0.5cc Celestone soluspan injected into the bilateral heels.  3. Rx for Gabapentin 100 mg provided to patient.  4. Rx for Meloxicam ordered for patient. 5. Plantar fascial band(s) dispensed for bilateral plantar fasciitis. 6. Instructed patient regarding therapies and modalities at home to alleviate symptoms.  7. Return to clinic in 4 weeks.    Edrick Kins, DPM [facility]  Dr. Edrick Kins, DPM    [address], [place] [address]                Office [phone]  Fax [phone]

## 2018-03-10 DIAGNOSIS — Z1231 Encounter for screening mammogram for malignant neoplasm of breast: Secondary | ICD-10-CM | POA: Diagnosis not present

## 2018-03-23 ENCOUNTER — Ambulatory Visit: Payer: Medicare Other | Admitting: Podiatry

## 2018-03-25 ENCOUNTER — Ambulatory Visit: Payer: Medicare Other | Admitting: Podiatry

## 2018-04-01 ENCOUNTER — Encounter: Payer: Self-pay | Admitting: Podiatry

## 2018-04-01 ENCOUNTER — Other Ambulatory Visit: Payer: Self-pay

## 2018-04-01 ENCOUNTER — Ambulatory Visit (INDEPENDENT_AMBULATORY_CARE_PROVIDER_SITE_OTHER): Payer: Medicare Other | Admitting: Podiatry

## 2018-04-01 DIAGNOSIS — E0843 Diabetes mellitus due to underlying condition with diabetic autonomic (poly)neuropathy: Secondary | ICD-10-CM

## 2018-04-01 DIAGNOSIS — M79676 Pain in unspecified toe(s): Secondary | ICD-10-CM

## 2018-04-01 DIAGNOSIS — B351 Tinea unguium: Secondary | ICD-10-CM

## 2018-04-01 DIAGNOSIS — M722 Plantar fascial fibromatosis: Secondary | ICD-10-CM

## 2018-04-06 NOTE — Progress Notes (Signed)
   SUBJECTIVE 53 year old female presenting today for follow up evaluation of bilateral plantar fasciitis. She states her pain has improved significantly and the tightness in the feet is resolving. She has been taking the Gabapentin and Meloxicam as directed which helps to alleviate the symptoms. She denies any worsening factors.  She also complains of elongated, thickened nails that cause pain while ambulating in shoes. She is unable to trim her own nails. Patient is here for further evaluation and treatment.   Past Medical History:  Diagnosis Date  . Depression   . Diabetes mellitus without complication ([place])   . GERD (gastroesophageal reflux disease)   . Hypercholesteremia   . Hypertension   . Hypokalemia   . Low blood potassium   . Ovarian cyst   . Sleep apnea     OBJECTIVE General Patient is awake, alert, and oriented x 3 and in no acute distress. Derm Skin is dry and supple bilateral. Negative open lesions or macerations. Remaining integument unremarkable. Nails are tender, long, thickened and dystrophic with subungual debris, consistent with onychomycosis, 1-5 bilateral. No signs of infection noted. Vasc  DP and PT pedal pulses palpable bilaterally. Temperature gradient within normal limits.  Neuro Epicritic and protective threshold sensation diminished bilaterally.  Musculoskeletal Exam No symptomatic pedal deformities noted bilateral. Muscular strength within normal limits.  ASSESSMENT 1. Diabetes Mellitus w/ peripheral neuropathy 2. Onychomycosis of nail due to dermatophyte bilateral 3. Plantar fasciitis bilateral feet - resolved   PLAN OF CARE 1. Patient evaluated today. 2. Instructed to maintain good pedal hygiene and foot care. Stressed importance of controlling blood sugar.  3. Mechanical debridement of nails 1-5 bilaterally performed using a nail nipper. Filed with dremel without incident.  4. Continue taking Gabapentin 100 mg QHS.  5. Continue taking Meloxicam as  needed.  6. Return to clinic as needed.     Edrick Kins, DPM [facility]  Dr. Edrick Kins, [address]                                        [place], [place] [address]                Office [phone]  Fax [phone]

## 2018-08-11 DIAGNOSIS — E1122 Type 2 diabetes mellitus with diabetic chronic kidney disease: Secondary | ICD-10-CM | POA: Diagnosis not present

## 2018-08-11 DIAGNOSIS — Z7984 Long term (current) use of oral hypoglycemic drugs: Secondary | ICD-10-CM | POA: Diagnosis not present

## 2018-08-11 DIAGNOSIS — N181 Chronic kidney disease, stage 1: Secondary | ICD-10-CM | POA: Diagnosis not present

## 2018-08-11 DIAGNOSIS — E7800 Pure hypercholesterolemia, unspecified: Secondary | ICD-10-CM | POA: Diagnosis not present

## 2018-08-11 DIAGNOSIS — I10 Essential (primary) hypertension: Secondary | ICD-10-CM | POA: Diagnosis not present

## 2018-08-21 DIAGNOSIS — E1122 Type 2 diabetes mellitus with diabetic chronic kidney disease: Secondary | ICD-10-CM | POA: Diagnosis not present

## 2018-08-21 DIAGNOSIS — E7800 Pure hypercholesterolemia, unspecified: Secondary | ICD-10-CM | POA: Diagnosis not present

## 2018-08-21 DIAGNOSIS — I10 Essential (primary) hypertension: Secondary | ICD-10-CM | POA: Diagnosis not present

## 2018-09-04 ENCOUNTER — Encounter (HOSPITAL_COMMUNITY): Payer: Self-pay | Admitting: Emergency Medicine

## 2018-09-04 ENCOUNTER — Ambulatory Visit (HOSPITAL_COMMUNITY)
Admission: EM | Admit: 2018-09-04 | Discharge: 2018-09-04 | Disposition: A | Discharge status: Home / Self Care | Payer: Medicare Other | Attending: Internal Medicine | Admitting: Internal Medicine

## 2018-09-04 ENCOUNTER — Other Ambulatory Visit: Payer: Self-pay

## 2018-09-04 DIAGNOSIS — Z20828 Contact with and (suspected) exposure to other viral communicable diseases: Secondary | ICD-10-CM | POA: Diagnosis not present

## 2018-09-04 DIAGNOSIS — I10 Essential (primary) hypertension: Secondary | ICD-10-CM | POA: Diagnosis not present

## 2018-09-04 DIAGNOSIS — Z20822 Contact with and (suspected) exposure to covid-19: Secondary | ICD-10-CM

## 2018-09-04 NOTE — ED Triage Notes (Signed)
Pt states she was exposed to someone positive for covid on August 4th. No symptoms.

## 2018-09-04 NOTE — Discharge Instructions (Signed)
Self isolate until results are back and negative.  May use over the counter treatments as needed for any symptoms.  Will notify you of any positive findings. You may monitor your results on your MyChart online as well.

## 2018-09-04 NOTE — ED Provider Notes (Signed)
[facility]    CSN: [id number] Arrival date & time: 09/04/18  1808      History   Chief Complaint Chief Complaint  Patient presents with  . Covid Exposure    HPI Barbara [name] is a 53 y.o. female.   Barbara [name] presents with complaints of exposure to covid-19 on august 4th. Has felt fine since. Denies any symptoms. No URI symptoms, no gu symptoms, no fever, no headache or body aches. History of depression, dm, gerd, htn, hypokalemia.     ROS per HPI, negative if not otherwise mentioned.      Past Medical History:  Diagnosis Date  . Depression   . Diabetes mellitus without complication ([place])   . GERD (gastroesophageal reflux disease)   . Hypercholesteremia   . Hypertension   . Hypokalemia   . Low blood potassium   . Ovarian cyst   . Sleep apnea     Patient Active Problem List   Diagnosis Date Noted  . Hypertension   . Hypercholesteremia   . Depression   . Low blood potassium   . GERD (gastroesophageal reflux disease)     Past Surgical History:  Procedure Laterality Date  . EXAMINATION UNDER ANESTHESIA N/A 10/30/2012   Procedure: EXAM UNDER ANESTHESIA;  Surgeon: Delice Lesch, MD;  Location: [facility];  Service: Gynecology;  Laterality: N/A;  . LAPAROSCOPIC LYSIS OF ADHESIONS N/A 10/30/2012   Procedure: LAPAROSCOPIC LYSIS OF ADHESIONS;  Surgeon: Delice Lesch, MD;  Location: [facility];  Service: Gynecology;  Laterality: N/A;  . LAPAROSCOPY N/A 10/30/2012   Procedure: LAPAROSCOPY OPERATIVE;  Surgeon: Delice Lesch, MD;  Location: [facility];  Service: Gynecology;  Laterality: N/A;  . PARTIAL HYSTERECTOMY  2009    OB History    Gravida  3   Para  2   Term      Preterm      AB  1   Living  2     SAB      TAB      Ectopic      Multiple      Live Births               Home Medications    Prior to Admission medications   Medication Sig Start Date End Date Taking? Authorizing Provider  acetaminophen (TYLENOL)  500 MG tablet Take 1,000 mg by mouth every 6 (six) hours as needed for mild pain or headache.     [provider]  amLODipine (NORVASC) 10 MG tablet Take 10 mg by mouth every morning.    [provider]  aspirin EC 81 MG tablet Take 81 mg by mouth daily.    [provider]  carvedilol (COREG) 12.5 MG tablet Take 12.5 mg by mouth 2 (two) times daily. 07/02/16   [provider]  cephALEXin (KEFLEX) 500 MG capsule Take 1 capsule (500 mg total) by mouth 2 (two) times daily. 12/03/17   Orpah Greek, MD  cetirizine (ZYRTEC) 10 MG tablet Take 10 mg by mouth every morning.    [provider]  cycloSPORINE (RESTASIS) 0.05 % ophthalmic emulsion Place 1 drop into both eyes 2 (two) times daily.    [provider]  fluconazole (DIFLUCAN) 150 MG tablet Take 150 mg by mouth daily. 12/03/17   [provider]  furosemide (LASIX) 40 MG tablet Take 40 mg by mouth every morning.     [provider]  gabapentin (NEURONTIN) 100 MG capsule Take  1 capsule (100 mg total) by mouth 2 (two) times daily. 02/23/18   Edrick Kins, DPM  Garlic 7169 MG CAPS Take 1,000 mg by mouth daily.    [provider]  metFORMIN (GLUCOPHAGE) 1000 MG tablet Take 1,000 mg by mouth daily.    [provider]  omeprazole (PRILOSEC) 20 MG capsule Take 20 mg by mouth daily as needed (acid reflux).     [provider]  potassium chloride SA (K-DUR,KLOR-CON) 20 MEQ tablet Take 20 mEq by mouth 2 (two) times daily.     [provider]  rosuvastatin (CRESTOR) 20 MG tablet Take 20 mg by mouth at bedtime. 08/03/16   [provider]  telmisartan (MICARDIS) 80 MG tablet Take 80 mg by mouth daily. 10/11/17   [provider]    Family History Family History  Problem Relation Age of Onset  . Cancer Paternal Grandmother 48       bone  . Diabetes Paternal Grandmother   . Diabetes Father     Social History Social History    Tobacco Use  . Smoking status: Never Smoker  . Smokeless tobacco: Never Used  Substance Use Topics  . Alcohol use: No  . Drug use: No     Allergies   Lisinopril and Tomato flavor [flavoring agent]   Review of Systems Review of Systems   Physical Exam Triage Vital Signs ED Triage Vitals  Enc Vitals Group     BP 09/04/18 1847 (!) 154/103     Pulse Rate 09/04/18 1847 76     Resp 09/04/18 1847 18     Temp 09/04/18 1847 97.6 F (36.4 C)     Temp src --      SpO2 09/04/18 1847 100 %     Weight --      Height --      Head Circumference --      Peak Flow --      Pain Score 09/04/18 1851 0     Pain Loc --      Pain Edu? --      Excl. in [place]? --    No data found.  Updated Vital Signs BP (!) 154/103   Pulse 76   Temp 97.6 F (36.4 C)   Resp 18   SpO2 100%    Physical Exam Constitutional:      General: She is not in acute distress.    Appearance: She is well-developed.  Cardiovascular:     Rate and Rhythm: Normal rate.  Pulmonary:     Effort: Pulmonary effort is normal.  Skin:    General: Skin is warm and dry.  Neurological:     Mental Status: She is alert and oriented to person, place, and time.      UC Treatments / Results  Labs (all labs ordered are listed, but only abnormal results are displayed) Labs Reviewed  NOVEL CORONAVIRUS, NAA (HOSPITAL ORDER, SEND-OUT TO REF LAB)    EKG   Radiology No results found.  Procedures Procedures (including critical care time)  Medications Ordered in UC Medications - No data to display  Initial Impression / Assessment and Plan / UC Course  I have reviewed the triage vital signs and the nursing notes.  Pertinent labs & imaging results that were available during my care of the patient were reviewed by me and considered in my medical decision making (see chart for details).     Asymptomatic. covid testing colleted and pending. Isolation discussed. Will notify  of any positive findings.  Patient verbalized  understanding and agreeable to plan.   Final Clinical Impressions(s) / UC Diagnoses   Final diagnoses:  Exposure to Covid-19 Virus     Discharge Instructions     Self isolate until results are back and negative.  May use over the counter treatments as needed for any symptoms.  Will notify you of any positive findings. You may monitor your results on your MyChart online as well.      ED Prescriptions    None     Controlled Substance Prescriptions [place] Controlled Substance Registry consulted? Not Applicable   Zigmund Gottron, NP 09/04/18 2011

## 2018-09-06 LAB — NOVEL CORONAVIRUS, NAA (HOSP ORDER, SEND-OUT TO REF LAB; TAT 18-24 HRS): SARS-CoV-2, NAA: NOT DETECTED

## 2018-09-22 DIAGNOSIS — L02411 Cutaneous abscess of right axilla: Secondary | ICD-10-CM | POA: Diagnosis not present

## 2018-10-14 DIAGNOSIS — N760 Acute vaginitis: Secondary | ICD-10-CM | POA: Diagnosis not present

## 2018-10-14 DIAGNOSIS — R21 Rash and other nonspecific skin eruption: Secondary | ICD-10-CM | POA: Diagnosis not present

## 2018-10-14 DIAGNOSIS — N898 Other specified noninflammatory disorders of vagina: Secondary | ICD-10-CM | POA: Diagnosis not present

## 2018-10-26 DIAGNOSIS — L918 Other hypertrophic disorders of the skin: Secondary | ICD-10-CM | POA: Diagnosis not present

## 2018-10-26 DIAGNOSIS — L83 Acanthosis nigricans: Secondary | ICD-10-CM | POA: Diagnosis not present

## 2018-10-26 DIAGNOSIS — L309 Dermatitis, unspecified: Secondary | ICD-10-CM | POA: Diagnosis not present

## 2019-01-27 DIAGNOSIS — G4733 Obstructive sleep apnea (adult) (pediatric): Secondary | ICD-10-CM | POA: Diagnosis not present

## 2019-02-23 DIAGNOSIS — J309 Allergic rhinitis, unspecified: Secondary | ICD-10-CM | POA: Diagnosis not present

## 2019-02-23 DIAGNOSIS — G4733 Obstructive sleep apnea (adult) (pediatric): Secondary | ICD-10-CM | POA: Diagnosis not present

## 2019-02-23 DIAGNOSIS — M25512 Pain in left shoulder: Secondary | ICD-10-CM | POA: Diagnosis not present

## 2019-02-23 DIAGNOSIS — F324 Major depressive disorder, single episode, in partial remission: Secondary | ICD-10-CM | POA: Diagnosis not present

## 2019-02-23 DIAGNOSIS — Z7984 Long term (current) use of oral hypoglycemic drugs: Secondary | ICD-10-CM | POA: Diagnosis not present

## 2019-02-23 DIAGNOSIS — Z0000 Encounter for general adult medical examination without abnormal findings: Secondary | ICD-10-CM | POA: Diagnosis not present

## 2019-02-23 DIAGNOSIS — N181 Chronic kidney disease, stage 1: Secondary | ICD-10-CM | POA: Diagnosis not present

## 2019-02-23 DIAGNOSIS — Z6835 Body mass index (BMI) 35.0-35.9, adult: Secondary | ICD-10-CM | POA: Diagnosis not present

## 2019-02-23 DIAGNOSIS — E7800 Pure hypercholesterolemia, unspecified: Secondary | ICD-10-CM | POA: Diagnosis not present

## 2019-02-23 DIAGNOSIS — I10 Essential (primary) hypertension: Secondary | ICD-10-CM | POA: Diagnosis not present

## 2019-02-23 DIAGNOSIS — E1122 Type 2 diabetes mellitus with diabetic chronic kidney disease: Secondary | ICD-10-CM | POA: Diagnosis not present

## 2019-03-02 DIAGNOSIS — L02416 Cutaneous abscess of left lower limb: Secondary | ICD-10-CM | POA: Diagnosis not present

## 2019-03-02 DIAGNOSIS — I10 Essential (primary) hypertension: Secondary | ICD-10-CM | POA: Diagnosis not present

## 2019-03-15 DIAGNOSIS — Z1231 Encounter for screening mammogram for malignant neoplasm of breast: Secondary | ICD-10-CM | POA: Diagnosis not present

## 2019-05-15 DIAGNOSIS — H40033 Anatomical narrow angle, bilateral: Secondary | ICD-10-CM | POA: Diagnosis not present

## 2019-05-15 DIAGNOSIS — E119 Type 2 diabetes mellitus without complications: Secondary | ICD-10-CM | POA: Diagnosis not present

## 2019-05-28 DIAGNOSIS — Z7984 Long term (current) use of oral hypoglycemic drugs: Secondary | ICD-10-CM | POA: Diagnosis not present

## 2019-05-28 DIAGNOSIS — E1165 Type 2 diabetes mellitus with hyperglycemia: Secondary | ICD-10-CM | POA: Diagnosis not present

## 2019-07-01 DIAGNOSIS — N898 Other specified noninflammatory disorders of vagina: Secondary | ICD-10-CM | POA: Diagnosis not present

## 2019-07-01 DIAGNOSIS — Z01419 Encounter for gynecological examination (general) (routine) without abnormal findings: Secondary | ICD-10-CM | POA: Diagnosis not present

## 2019-07-01 DIAGNOSIS — N762 Acute vulvitis: Secondary | ICD-10-CM | POA: Diagnosis not present

## 2019-07-01 DIAGNOSIS — B379 Candidiasis, unspecified: Secondary | ICD-10-CM | POA: Diagnosis not present

## 2019-07-21 ENCOUNTER — Other Ambulatory Visit: Payer: Self-pay

## 2019-07-21 ENCOUNTER — Ambulatory Visit (INDEPENDENT_AMBULATORY_CARE_PROVIDER_SITE_OTHER): Payer: Medicare Other | Admitting: Podiatry

## 2019-07-21 VITALS — Temp 96.7°F

## 2019-07-21 DIAGNOSIS — E0843 Diabetes mellitus due to underlying condition with diabetic autonomic (poly)neuropathy: Secondary | ICD-10-CM | POA: Diagnosis not present

## 2019-07-21 DIAGNOSIS — M722 Plantar fascial fibromatosis: Secondary | ICD-10-CM

## 2019-07-21 DIAGNOSIS — G629 Polyneuropathy, unspecified: Secondary | ICD-10-CM

## 2019-07-21 MED ORDER — GABAPENTIN 100 MG PO CAPS: 100.0000 mg | ORAL_CAPSULE | Freq: Three times a day (TID) | ORAL | 2 refills | Status: DC

## 2019-07-21 MED ORDER — MELOXICAM 15 MG PO TABS: 15.0000 mg | ORAL_TABLET | Freq: Every day | ORAL | 1 refills | Status: DC

## 2019-07-21 NOTE — Progress Notes (Signed)
   SUBJECTIVE 54 year old female presenting today for follow up evaluation of bilateral plantar fasciitis and peripheral polyneuropathy secondary to her diabetes mellitus.  She states that the gabapentin she was taking did help significantly.  She has some low-grade plantar fascial pain that is somewhat tolerable.  Past Medical History:  Diagnosis Date  . Depression   . Diabetes mellitus without complication ([place])   . GERD (gastroesophageal reflux disease)   . Hypercholesteremia   . Hypertension   . Hypokalemia   . Low blood potassium   . Ovarian cyst   . Sleep apnea     OBJECTIVE General Patient is awake, alert, and oriented x 3 and in no acute distress. Derm Skin is dry and supple bilateral. Negative open lesions or macerations. Remaining integument unremarkable.No signs of infection noted. Vasc  DP and PT pedal pulses palpable bilaterally. Temperature gradient within normal limits.  Neuro Epicritic and protective threshold sensation diminished bilaterally.  Musculoskeletal Exam No symptomatic pedal deformities noted bilateral. Muscular strength within normal limits.  ASSESSMENT 1. Diabetes Mellitus w/ peripheral neuropathy 2.  Peripheral polyneuropathy 3. Plantar fasciitis bilateral feet   PLAN OF CARE 1. Patient evaluated today. 2. Instructed to maintain good pedal hygiene and foot care. Stressed importance of controlling blood sugar.  3.  Refill prescription for gabapentin 100 mg QHS.  5.  Refill prescription for meloxicam as needed.  6.  Patient declined injections today for the plantar fasciitis  7.  Return to clinic as needed.     Edrick Kins, DPM [facility]  Dr. Edrick Kins, [address]                                        [place], [place] [address]                Office [phone]  Fax [phone]

## 2019-08-23 DIAGNOSIS — I10 Essential (primary) hypertension: Secondary | ICD-10-CM | POA: Diagnosis not present

## 2019-08-23 DIAGNOSIS — E1122 Type 2 diabetes mellitus with diabetic chronic kidney disease: Secondary | ICD-10-CM | POA: Diagnosis not present

## 2019-08-23 DIAGNOSIS — E7800 Pure hypercholesterolemia, unspecified: Secondary | ICD-10-CM | POA: Diagnosis not present

## 2019-08-23 DIAGNOSIS — Z7984 Long term (current) use of oral hypoglycemic drugs: Secondary | ICD-10-CM | POA: Diagnosis not present

## 2019-08-23 DIAGNOSIS — N181 Chronic kidney disease, stage 1: Secondary | ICD-10-CM | POA: Diagnosis not present

## 2019-08-27 DIAGNOSIS — H43393 Other vitreous opacities, bilateral: Secondary | ICD-10-CM | POA: Diagnosis not present

## 2019-09-30 IMAGING — CT CT RENAL STONE PROTOCOL
2 of 4 series · 16 of 46 positions shown, 18 images · non-contrast
Comparison: Pelvic MRI April 09, 2013

CLINICAL DATA: Bilateral leg, groin, shoulder and upper back pain
for 3 weeks. History of laparoscopic surgery October 2012,
hysterectomy.

EXAM:
CT ABDOMEN AND PELVIS WITHOUT CONTRAST
TECHNIQUE: Multidetector CT imaging of the abdomen and pelvis was performed
following the standard protocol without IV contrast.

[Series 2: axial st · axial · 0.64mm/px · z∈[+1080,+1425]mm · 13 of 79 slices shown, 15 images]
[im 5/79  soft-tissue]
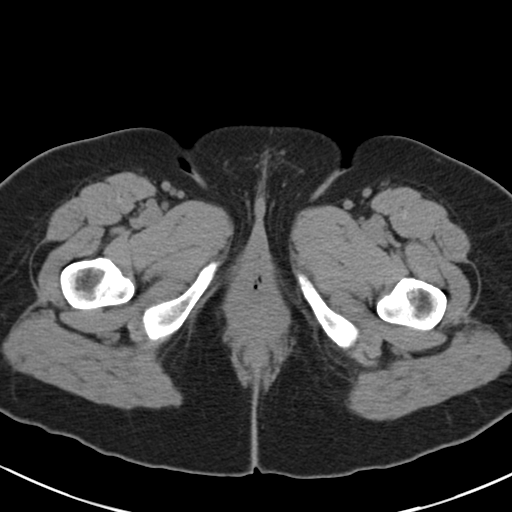
[im 5/79  bone]
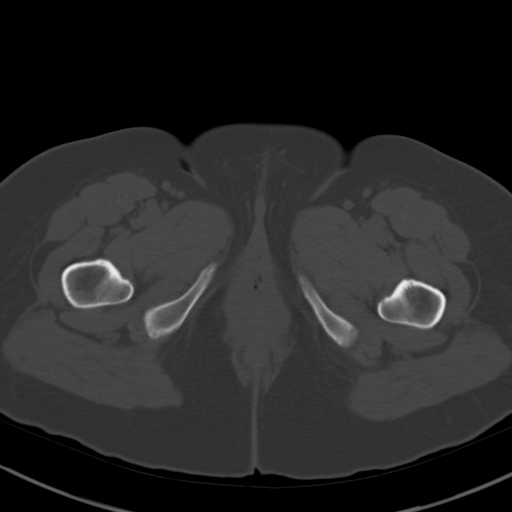
[im 13/79  soft-tissue]
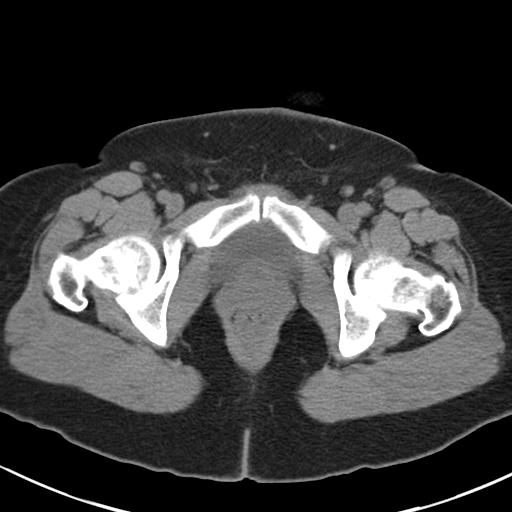
[im 17/79  soft-tissue]
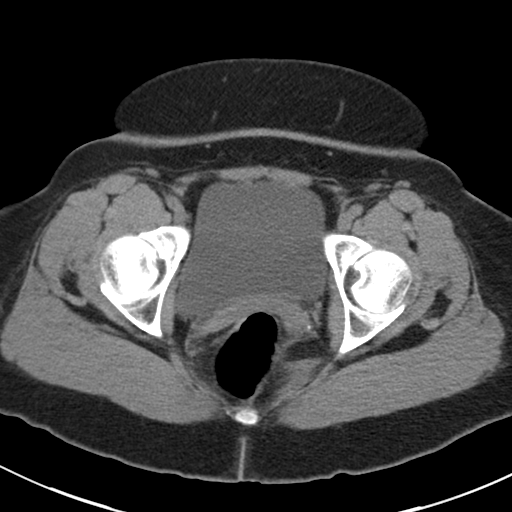
[im 21/79  soft-tissue]
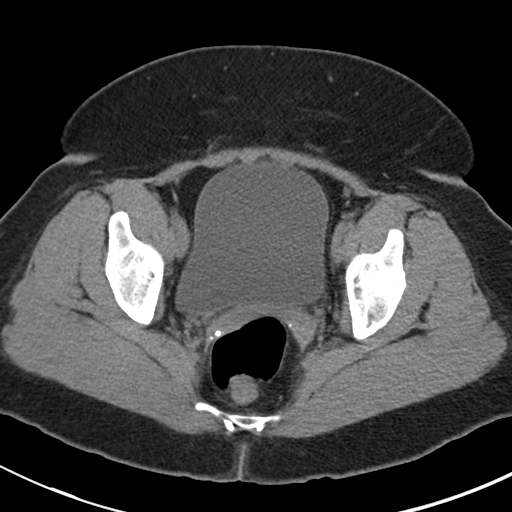
[im 29/79  soft-tissue]
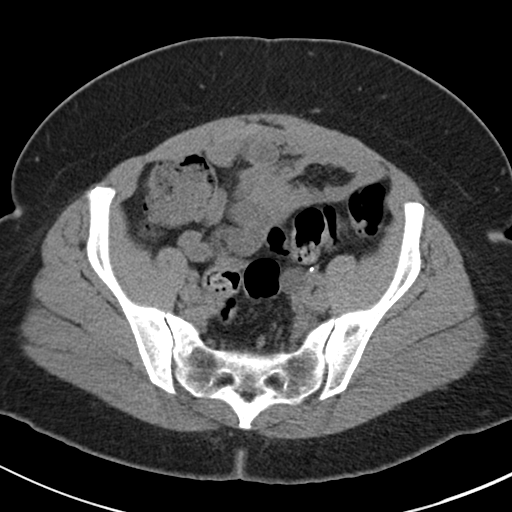
[im 33/79  soft-tissue]
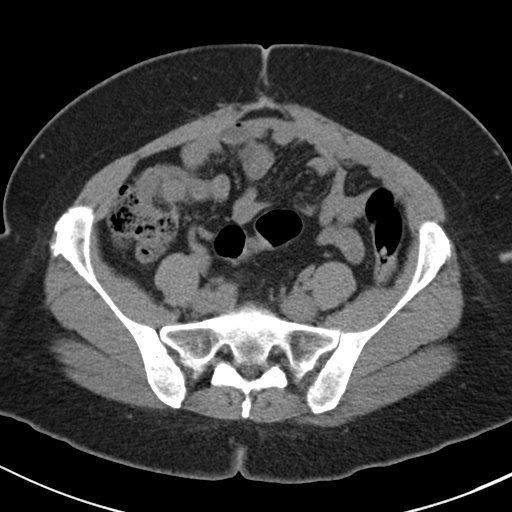
[im 42/79  soft-tissue]
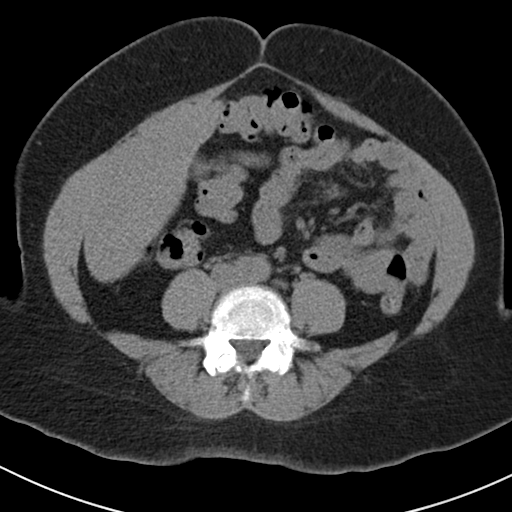
[im 46/79  soft-tissue]
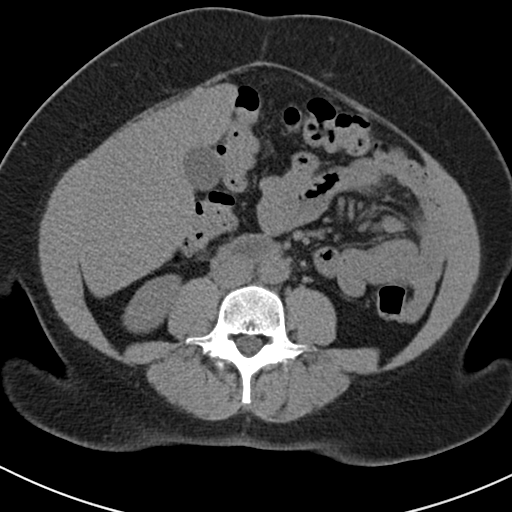
[im 50/79  soft-tissue]
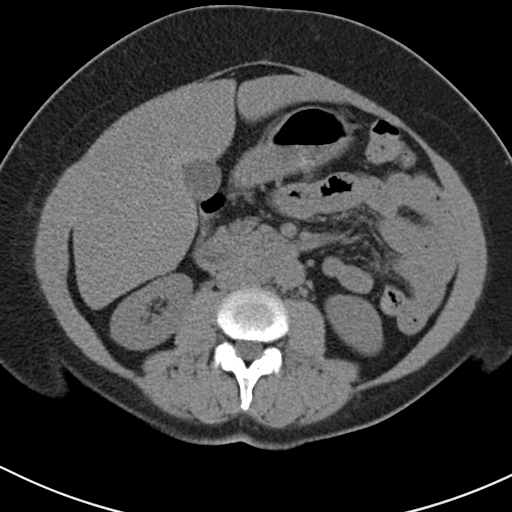
[im 50/79  bone]
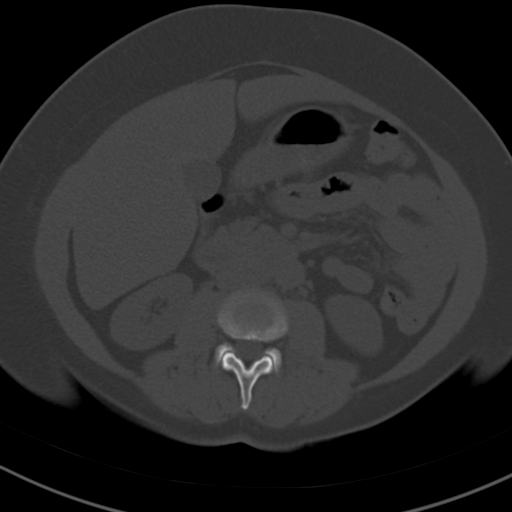
[im 58/79  soft-tissue]
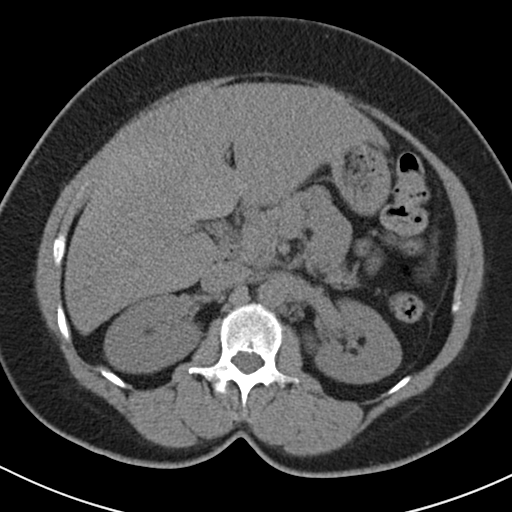
[im 62/79  soft-tissue]
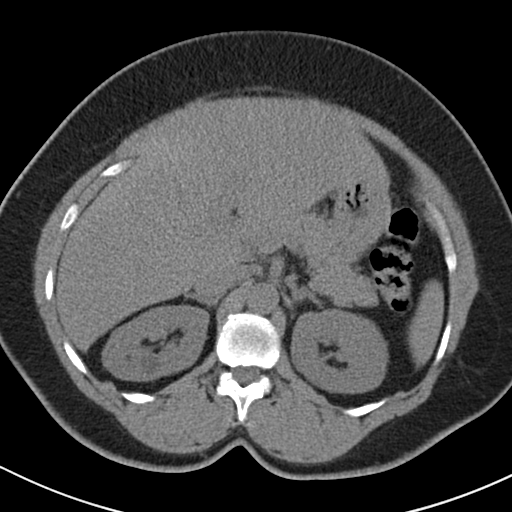
[im 66/79  soft-tissue]
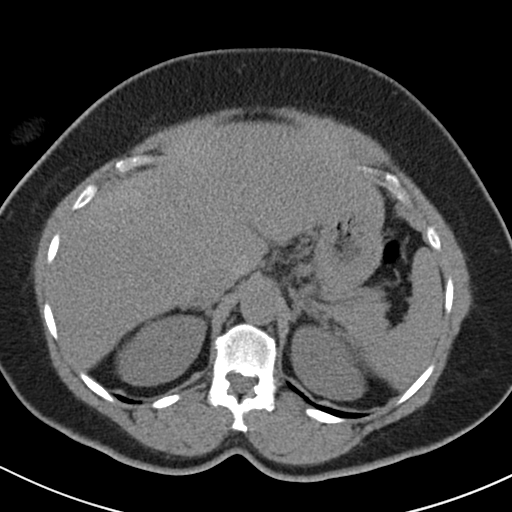
[im 74/79  soft-tissue]
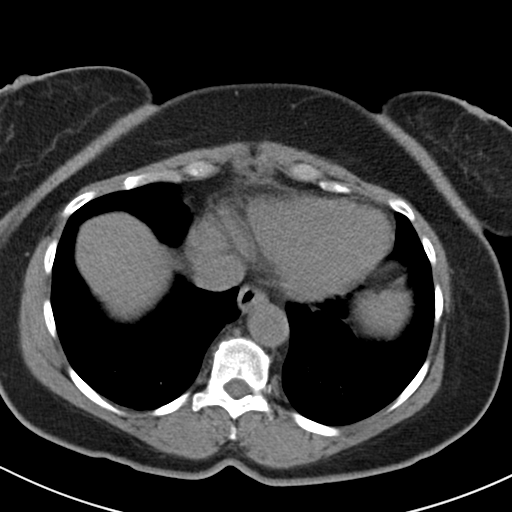

[Series 4: coronal · coronal · 0.63mm/px · 3 of 129 slices shown]
[im 43/129  soft-tissue]
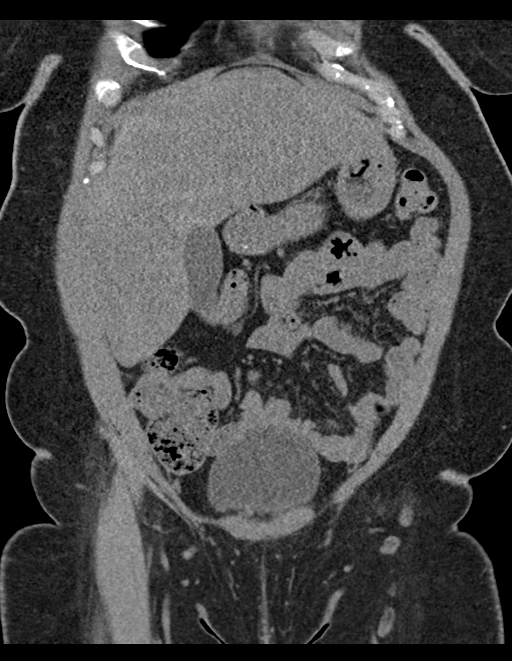
[im 57/129  soft-tissue]
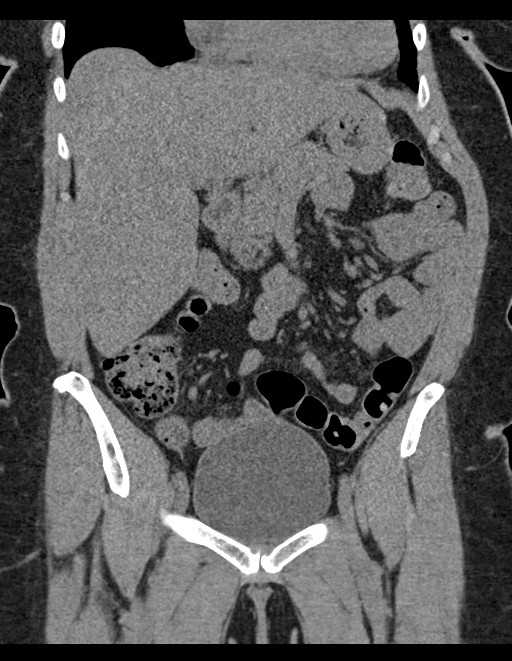
[im 72/129  soft-tissue]
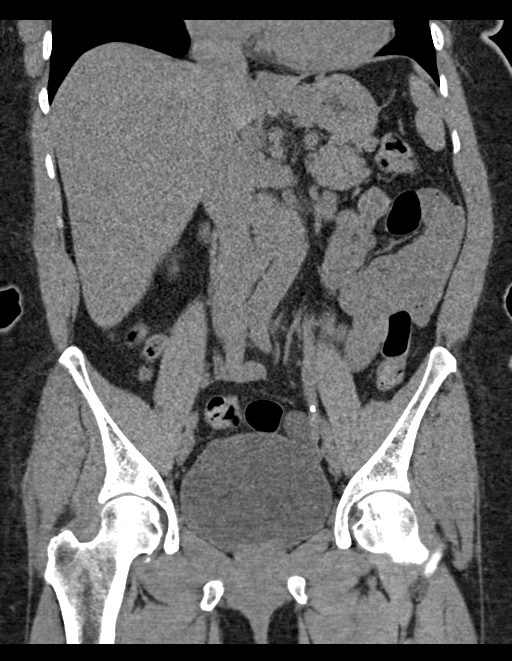

[16 of 46 positions shown; findings below may reference images not displayed]

FINDINGS: LOWER CHEST: Mild dependent atelectasis. The visualized heart size
is normal. No pericardial effusion.

HEPATOBILIARY: Mild hepatomegaly. Normal gallbladder.

PANCREAS: Normal.

SPLEEN: Normal.

ADRENALS/URINARY TRACT: Kidneys are orthotopic, demonstrating normal
size and morphology. No nephrolithiasis, hydronephrosis; limited
assessment for renal masses by nonenhanced CT. 12 mm hypodense RIGHT
interpolar cyst. The unopacified ureters are normal in course and
caliber. Urinary bladder is well distended and unremarkable. Normal
adrenal glands.

STOMACH/BOWEL: The stomach, small and large bowel are normal in
course and caliber without inflammatory changes, sensitivity
decreased by lack of enteric contrast. Normal appendix.

VASCULAR/LYMPHATIC: Aortoiliac vessels are normal in course and
caliber. No lymphadenopathy by CT size criteria.

REPRODUCTIVE: Status post hysterectomy.

OTHER: No intraperitoneal free fluid or free air.

MUSCULOSKELETAL: Non-acute. Anterior abdominal wall scarring.
Moderate sacroiliac osteoarthrosis. No advanced degenerative changes
lumbar spine.
IMPRESSION: 1. No urolithiasis, obstructive uropathy or acute
intra-abdominal/pelvic process.
2. Mild hepatomegaly.

## 2020-01-27 DIAGNOSIS — G4733 Obstructive sleep apnea (adult) (pediatric): Secondary | ICD-10-CM | POA: Diagnosis not present

## 2020-02-28 DIAGNOSIS — E1122 Type 2 diabetes mellitus with diabetic chronic kidney disease: Secondary | ICD-10-CM | POA: Diagnosis not present

## 2020-02-28 DIAGNOSIS — Z7984 Long term (current) use of oral hypoglycemic drugs: Secondary | ICD-10-CM | POA: Diagnosis not present

## 2020-02-28 DIAGNOSIS — Z0000 Encounter for general adult medical examination without abnormal findings: Secondary | ICD-10-CM | POA: Diagnosis not present

## 2020-02-28 DIAGNOSIS — F324 Major depressive disorder, single episode, in partial remission: Secondary | ICD-10-CM | POA: Diagnosis not present

## 2020-02-28 DIAGNOSIS — N181 Chronic kidney disease, stage 1: Secondary | ICD-10-CM | POA: Diagnosis not present

## 2020-02-28 DIAGNOSIS — I10 Essential (primary) hypertension: Secondary | ICD-10-CM | POA: Diagnosis not present

## 2020-02-28 DIAGNOSIS — Z23 Encounter for immunization: Secondary | ICD-10-CM | POA: Diagnosis not present

## 2020-02-28 DIAGNOSIS — E7800 Pure hypercholesterolemia, unspecified: Secondary | ICD-10-CM | POA: Diagnosis not present

## 2020-02-28 DIAGNOSIS — G4733 Obstructive sleep apnea (adult) (pediatric): Secondary | ICD-10-CM | POA: Diagnosis not present

## 2020-03-20 DIAGNOSIS — Z1231 Encounter for screening mammogram for malignant neoplasm of breast: Secondary | ICD-10-CM | POA: Diagnosis not present

## 2020-04-30 ENCOUNTER — Other Ambulatory Visit: Payer: Self-pay | Admitting: Podiatry

## 2020-07-03 ENCOUNTER — Other Ambulatory Visit: Payer: Self-pay | Admitting: Podiatry

## 2020-07-04 NOTE — Telephone Encounter (Signed)
Please advise 

## 2020-07-26 ENCOUNTER — Other Ambulatory Visit: Payer: Self-pay

## 2020-07-26 ENCOUNTER — Ambulatory Visit (INDEPENDENT_AMBULATORY_CARE_PROVIDER_SITE_OTHER): Payer: Medicare Other | Admitting: Podiatry

## 2020-07-26 DIAGNOSIS — M722 Plantar fascial fibromatosis: Secondary | ICD-10-CM

## 2020-07-26 MED ORDER — MELOXICAM 15 MG PO TABS: 15.0000 mg | ORAL_TABLET | Freq: Every day | ORAL | 1 refills | Status: DC

## 2020-07-26 MED ORDER — BETAMETHASONE SOD PHOS & ACET 6 (3-3) MG/ML IJ SUSP
3.0000 mg | Freq: Once | INTRAMUSCULAR | Status: AC
  Administered 2020-07-26: 3 mg via INTRA_ARTICULAR

## 2020-07-26 NOTE — Progress Notes (Signed)
   Subjective: 55 year old female with PMHx of DM presenting today with a chief complaint of intermittent aching, burning pain to the plantar aspects of the bilateral heels.  Patient states that over the past year she felt significantly better with the injections.  She continues to take gabapentin and meloxicam as prescribed. Walking increases the pain. She has not done anything for treatment. Patient is here for further evaluation and treatment.   Past Medical History:  Diagnosis Date   Depression    Diabetes mellitus without complication (HCC)    GERD (gastroesophageal reflux disease)    Hypercholesteremia    Hypertension    Hypokalemia    Low blood potassium    Ovarian cyst    Sleep apnea      Objective: Physical Exam General: The patient is alert and oriented x3 in no acute distress.  Dermatology: Skin is warm, dry and supple bilateral lower extremities. Negative for open lesions or macerations bilateral.   Vascular: Dorsalis Pedis and Posterior Tibial pulses palpable bilateral.  Capillary fill time is immediate to all digits.  Neurological: Epicritic and protective threshold diminished bilateral.   Musculoskeletal: Tenderness to palpation to the plantar aspect of the bilateral heels along the plantar fascia. All other joints range of motion within normal limits bilateral. Strength 5/5 in all groups bilateral.   Assessment: 1. plantar fasciitis bilateral feet 2. DM with peripheral neuropathy   Plan of Care:  1. Patient evaluated.    2. Injection of 0.5cc Celestone soluspan injected into the bilateral heels.  3.  Continue gabapentin 100 mg nightly..  4.  Refill Rx for Meloxicam ordered for patient.  Continue daily as needed 5. INstructed patient regarding therapies and modalities at home to alleviate symptoms.  7. Return to clinic as needed  Edrick Kins, DPM [facility]  Dr. Edrick Kins, DPM    [address], [place] [address]                Office [phone]  Fax [phone]

## 2020-08-08 ENCOUNTER — Other Ambulatory Visit: Payer: Self-pay | Admitting: Podiatry

## 2020-08-08 NOTE — Telephone Encounter (Signed)
Please advise 

## 2020-08-28 DIAGNOSIS — Z7984 Long term (current) use of oral hypoglycemic drugs: Secondary | ICD-10-CM | POA: Diagnosis not present

## 2020-08-28 DIAGNOSIS — N181 Chronic kidney disease, stage 1: Secondary | ICD-10-CM | POA: Diagnosis not present

## 2020-08-28 DIAGNOSIS — I10 Essential (primary) hypertension: Secondary | ICD-10-CM | POA: Diagnosis not present

## 2020-08-28 DIAGNOSIS — E1122 Type 2 diabetes mellitus with diabetic chronic kidney disease: Secondary | ICD-10-CM | POA: Diagnosis not present

## 2020-08-28 DIAGNOSIS — E1140 Type 2 diabetes mellitus with diabetic neuropathy, unspecified: Secondary | ICD-10-CM | POA: Diagnosis not present

## 2020-08-28 DIAGNOSIS — E7800 Pure hypercholesterolemia, unspecified: Secondary | ICD-10-CM | POA: Diagnosis not present

## 2020-12-27 ENCOUNTER — Other Ambulatory Visit: Payer: Self-pay | Admitting: Podiatry

## 2020-12-27 NOTE — Telephone Encounter (Signed)
Please advise 

## 2020-12-30 ENCOUNTER — Other Ambulatory Visit: Payer: Self-pay | Admitting: Podiatry

## 2020-12-31 NOTE — Telephone Encounter (Signed)
Please advise 

## 2021-01-24 DIAGNOSIS — H2513 Age-related nuclear cataract, bilateral: Secondary | ICD-10-CM | POA: Diagnosis not present

## 2021-01-24 DIAGNOSIS — H34812 Central retinal vein occlusion, left eye, with macular edema: Secondary | ICD-10-CM | POA: Diagnosis not present

## 2021-01-24 DIAGNOSIS — H40033 Anatomical narrow angle, bilateral: Secondary | ICD-10-CM | POA: Diagnosis not present

## 2021-01-24 DIAGNOSIS — H43393 Other vitreous opacities, bilateral: Secondary | ICD-10-CM | POA: Diagnosis not present

## 2021-01-24 DIAGNOSIS — E119 Type 2 diabetes mellitus without complications: Secondary | ICD-10-CM | POA: Diagnosis not present

## 2021-01-31 DIAGNOSIS — G4733 Obstructive sleep apnea (adult) (pediatric): Secondary | ICD-10-CM | POA: Diagnosis not present

## 2021-02-21 DIAGNOSIS — H34812 Central retinal vein occlusion, left eye, with macular edema: Secondary | ICD-10-CM | POA: Diagnosis not present

## 2021-03-16 DIAGNOSIS — E7800 Pure hypercholesterolemia, unspecified: Secondary | ICD-10-CM | POA: Diagnosis not present

## 2021-03-16 DIAGNOSIS — G4733 Obstructive sleep apnea (adult) (pediatric): Secondary | ICD-10-CM | POA: Diagnosis not present

## 2021-03-16 DIAGNOSIS — E1122 Type 2 diabetes mellitus with diabetic chronic kidney disease: Secondary | ICD-10-CM | POA: Diagnosis not present

## 2021-03-16 DIAGNOSIS — E6609 Other obesity due to excess calories: Secondary | ICD-10-CM | POA: Diagnosis not present

## 2021-03-16 DIAGNOSIS — Z0000 Encounter for general adult medical examination without abnormal findings: Secondary | ICD-10-CM | POA: Diagnosis not present

## 2021-03-16 DIAGNOSIS — F324 Major depressive disorder, single episode, in partial remission: Secondary | ICD-10-CM | POA: Diagnosis not present

## 2021-03-16 DIAGNOSIS — Z23 Encounter for immunization: Secondary | ICD-10-CM | POA: Diagnosis not present

## 2021-03-16 DIAGNOSIS — Z7984 Long term (current) use of oral hypoglycemic drugs: Secondary | ICD-10-CM | POA: Diagnosis not present

## 2021-03-16 DIAGNOSIS — R600 Localized edema: Secondary | ICD-10-CM | POA: Diagnosis not present

## 2021-03-16 DIAGNOSIS — I10 Essential (primary) hypertension: Secondary | ICD-10-CM | POA: Diagnosis not present

## 2021-03-16 DIAGNOSIS — N181 Chronic kidney disease, stage 1: Secondary | ICD-10-CM | POA: Diagnosis not present

## 2021-03-21 DIAGNOSIS — H34812 Central retinal vein occlusion, left eye, with macular edema: Secondary | ICD-10-CM | POA: Diagnosis not present

## 2021-03-26 DIAGNOSIS — Z1231 Encounter for screening mammogram for malignant neoplasm of breast: Secondary | ICD-10-CM | POA: Diagnosis not present

## 2021-04-18 DIAGNOSIS — H34812 Central retinal vein occlusion, left eye, with macular edema: Secondary | ICD-10-CM | POA: Diagnosis not present

## 2021-04-30 DIAGNOSIS — Z20822 Contact with and (suspected) exposure to covid-19: Secondary | ICD-10-CM | POA: Diagnosis not present

## 2021-05-10 DIAGNOSIS — M25561 Pain in right knee: Secondary | ICD-10-CM | POA: Diagnosis not present

## 2021-05-10 DIAGNOSIS — M25562 Pain in left knee: Secondary | ICD-10-CM | POA: Diagnosis not present

## 2021-05-16 DIAGNOSIS — H34812 Central retinal vein occlusion, left eye, with macular edema: Secondary | ICD-10-CM | POA: Diagnosis not present

## 2021-06-27 DIAGNOSIS — H34812 Central retinal vein occlusion, left eye, with macular edema: Secondary | ICD-10-CM | POA: Diagnosis not present

## 2021-07-03 DIAGNOSIS — Z01419 Encounter for gynecological examination (general) (routine) without abnormal findings: Secondary | ICD-10-CM | POA: Diagnosis not present

## 2021-07-03 DIAGNOSIS — N898 Other specified noninflammatory disorders of vagina: Secondary | ICD-10-CM | POA: Diagnosis not present

## 2021-07-03 DIAGNOSIS — B372 Candidiasis of skin and nail: Secondary | ICD-10-CM | POA: Diagnosis not present

## 2021-08-22 DIAGNOSIS — H34812 Central retinal vein occlusion, left eye, with macular edema: Secondary | ICD-10-CM | POA: Diagnosis not present

## 2021-09-12 DIAGNOSIS — E1122 Type 2 diabetes mellitus with diabetic chronic kidney disease: Secondary | ICD-10-CM | POA: Diagnosis not present

## 2021-09-12 DIAGNOSIS — E7800 Pure hypercholesterolemia, unspecified: Secondary | ICD-10-CM | POA: Diagnosis not present

## 2021-09-12 DIAGNOSIS — N181 Chronic kidney disease, stage 1: Secondary | ICD-10-CM | POA: Diagnosis not present

## 2021-09-12 DIAGNOSIS — I10 Essential (primary) hypertension: Secondary | ICD-10-CM | POA: Diagnosis not present

## 2021-10-19 DIAGNOSIS — H34812 Central retinal vein occlusion, left eye, with macular edema: Secondary | ICD-10-CM | POA: Diagnosis not present

## 2021-12-06 ENCOUNTER — Other Ambulatory Visit: Payer: Self-pay | Admitting: Podiatry

## 2021-12-07 DIAGNOSIS — E119 Type 2 diabetes mellitus without complications: Secondary | ICD-10-CM | POA: Diagnosis not present

## 2021-12-07 DIAGNOSIS — H34812 Central retinal vein occlusion, left eye, with macular edema: Secondary | ICD-10-CM | POA: Diagnosis not present

## 2021-12-07 DIAGNOSIS — H2513 Age-related nuclear cataract, bilateral: Secondary | ICD-10-CM | POA: Diagnosis not present

## 2021-12-21 ENCOUNTER — Other Ambulatory Visit: Payer: Self-pay | Admitting: Podiatry

## 2021-12-27 ENCOUNTER — Other Ambulatory Visit: Payer: Self-pay | Admitting: Podiatry

## 2022-01-01 ENCOUNTER — Other Ambulatory Visit: Payer: Self-pay | Admitting: Podiatry

## 2022-01-30 DIAGNOSIS — G4733 Obstructive sleep apnea (adult) (pediatric): Secondary | ICD-10-CM | POA: Diagnosis not present

## 2022-02-01 DIAGNOSIS — H34812 Central retinal vein occlusion, left eye, with macular edema: Secondary | ICD-10-CM | POA: Diagnosis not present

## 2022-02-01 DIAGNOSIS — H2513 Age-related nuclear cataract, bilateral: Secondary | ICD-10-CM | POA: Diagnosis not present

## 2022-02-01 DIAGNOSIS — E119 Type 2 diabetes mellitus without complications: Secondary | ICD-10-CM | POA: Diagnosis not present

## 2022-04-03 DIAGNOSIS — R600 Localized edema: Secondary | ICD-10-CM | POA: Diagnosis not present

## 2022-04-03 DIAGNOSIS — G4733 Obstructive sleep apnea (adult) (pediatric): Secondary | ICD-10-CM | POA: Diagnosis not present

## 2022-04-03 DIAGNOSIS — F324 Major depressive disorder, single episode, in partial remission: Secondary | ICD-10-CM | POA: Diagnosis not present

## 2022-04-03 DIAGNOSIS — E7800 Pure hypercholesterolemia, unspecified: Secondary | ICD-10-CM | POA: Diagnosis not present

## 2022-04-03 DIAGNOSIS — H34812 Central retinal vein occlusion, left eye, with macular edema: Secondary | ICD-10-CM | POA: Diagnosis not present

## 2022-04-03 DIAGNOSIS — E6609 Other obesity due to excess calories: Secondary | ICD-10-CM | POA: Diagnosis not present

## 2022-04-03 DIAGNOSIS — I10 Essential (primary) hypertension: Secondary | ICD-10-CM | POA: Diagnosis not present

## 2022-04-03 DIAGNOSIS — N181 Chronic kidney disease, stage 1: Secondary | ICD-10-CM | POA: Diagnosis not present

## 2022-04-03 DIAGNOSIS — Z0000 Encounter for general adult medical examination without abnormal findings: Secondary | ICD-10-CM | POA: Diagnosis not present

## 2022-04-03 DIAGNOSIS — E1122 Type 2 diabetes mellitus with diabetic chronic kidney disease: Secondary | ICD-10-CM | POA: Diagnosis not present

## 2022-04-12 DIAGNOSIS — H34812 Central retinal vein occlusion, left eye, with macular edema: Secondary | ICD-10-CM | POA: Diagnosis not present

## 2022-04-12 DIAGNOSIS — Z1231 Encounter for screening mammogram for malignant neoplasm of breast: Secondary | ICD-10-CM | POA: Diagnosis not present

## 2022-04-15 ENCOUNTER — Telehealth: Payer: Self-pay | Admitting: *Deleted

## 2022-04-15 NOTE — Progress Notes (Signed)
  Care Coordination  Outreach Note  04/15/2022 Name: LIVINIA WRENCH MRN: [id number] DOB: 10-22-1965   Care Coordination Outreach Attempts: An unsuccessful telephone outreach was attempted today to offer the patient information about available care coordination services as a benefit of their health plan.   Follow Up Plan:  Additional outreach attempts will be made to offer the patient care coordination information and services.   Encounter Outcome:  No Answer  [place]  Direct Dial: [phone]

## 2022-04-18 NOTE — Progress Notes (Signed)
  Care Coordination  Outreach Note  04/18/2022 Name: Barbara [name] MRN: [id number] DOB: 12/26/1965   Care Coordination Outreach Attempts: A second unsuccessful outreach was attempted today to offer the patient with information about available care coordination services as a benefit of their health plan.     Follow Up Plan:  Additional outreach attempts will be made to offer the patient care coordination information and services.   Encounter Outcome:  No Answer  [place]  Direct Dial: [phone]

## 2022-04-22 NOTE — Progress Notes (Signed)
  Care Coordination  Outreach Note  04/22/2022 Name: Barbara [name] MRN: [id number] DOB: [birth year]   Care Coordination Outreach Attempts: A third unsuccessful outreach was attempted today to offer the patient with information about available care coordination services as a benefit of their health plan.   Follow Up Plan:  No further outreach attempts will be made at this time. We have been unable to contact the patient to offer or enroll patient in care coordination services  Encounter Outcome:  No Answer  [place]: [phone]

## 2022-05-28 DIAGNOSIS — G4733 Obstructive sleep apnea (adult) (pediatric): Secondary | ICD-10-CM | POA: Diagnosis not present

## 2022-07-22 ENCOUNTER — Ambulatory Visit (INDEPENDENT_AMBULATORY_CARE_PROVIDER_SITE_OTHER): Payer: Medicare Other | Admitting: Podiatry

## 2022-07-22 DIAGNOSIS — E119 Type 2 diabetes mellitus without complications: Secondary | ICD-10-CM

## 2022-07-22 DIAGNOSIS — E0843 Diabetes mellitus due to underlying condition with diabetic autonomic (poly)neuropathy: Secondary | ICD-10-CM | POA: Diagnosis not present

## 2022-07-22 MED ORDER — GABAPENTIN 100 MG PO CAPS: 100.0000 mg | ORAL_CAPSULE | Freq: Three times a day (TID) | ORAL | 3 refills | Status: AC

## 2022-07-22 NOTE — Progress Notes (Signed)
   Chief Complaint  Patient presents with   Numbness    Pt stated that she is still having numbness feels like pin and needles she stated that the medicine was helping but she is out and needs a refill of it     HPI: 57 y.o. female presenting today for routine annual diabetic foot exam.  Patient states that she recently ran out of her gabapentin.  It helped tremendously with her pins-and-needles and neuropathy.  No new complaints.  Past Medical History:  Diagnosis Date   Depression    Diabetes mellitus without complication (HCC)    GERD (gastroesophageal reflux disease)    Hypercholesteremia    Hypertension    Hypokalemia    Low blood potassium    Ovarian cyst    Sleep apnea     Past Surgical History:  Procedure Laterality Date   EXAMINATION UNDER ANESTHESIA N/A 10/30/2012   Procedure: EXAM UNDER ANESTHESIA;  Surgeon: Purcell Nails, MD;  Location: [facility];  Service: Gynecology;  Laterality: N/A;   LAPAROSCOPIC LYSIS OF ADHESIONS N/A 10/30/2012   Procedure: LAPAROSCOPIC LYSIS OF ADHESIONS;  Surgeon: Purcell Nails, MD;  Location: [facility];  Service: Gynecology;  Laterality: N/A;   LAPAROSCOPY N/A 10/30/2012   Procedure: LAPAROSCOPY OPERATIVE;  Surgeon: Purcell Nails, MD;  Location: [facility];  Service: Gynecology;  Laterality: N/A;   PARTIAL HYSTERECTOMY  2009    Allergies  Allergen Reactions   Lisinopril Itching and Swelling    Swelling in face and eyes   Tomato Flavor [Flavoring Agent] Itching     Physical Exam: General: The patient is alert and oriented x3 in no acute distress.  Dermatology: Skin is warm, dry and supple bilateral lower extremities.   Vascular: Palpable pedal pulses bilaterally. Capillary refill within normal limits.  No appreciable edema.  No erythema.  Neurological: Grossly intact via light touch  Musculoskeletal Exam: No pedal deformities noted   Assessment/Plan of Care: 1.  Diabetes mellitus with peripheral polyneuropathy  -Patient  evaluated.  Comprehensive diabetic foot exam performed today -Refill prescription for gabapentin 100 mg TID #180 w/ 3 refills.  Patient states that the gabapentin does help significantly alleviate her neuropathic pain -Continue good foot hygiene.  Advised against going barefoot -Return to clinic annually       Felecia Shelling, DPM [facility]  Dr. Felecia Shelling, DPM    [address] [place], [place] [address]                Office [phone]  Fax [phone]

## 2022-08-02 DIAGNOSIS — H34812 Central retinal vein occlusion, left eye, with macular edema: Secondary | ICD-10-CM | POA: Diagnosis not present

## 2022-09-27 DIAGNOSIS — H34812 Central retinal vein occlusion, left eye, with macular edema: Secondary | ICD-10-CM | POA: Diagnosis not present

## 2022-10-04 DIAGNOSIS — R600 Localized edema: Secondary | ICD-10-CM | POA: Diagnosis not present

## 2022-10-04 DIAGNOSIS — N181 Chronic kidney disease, stage 1: Secondary | ICD-10-CM | POA: Diagnosis not present

## 2022-10-04 DIAGNOSIS — E7800 Pure hypercholesterolemia, unspecified: Secondary | ICD-10-CM | POA: Diagnosis not present

## 2022-10-04 DIAGNOSIS — E1140 Type 2 diabetes mellitus with diabetic neuropathy, unspecified: Secondary | ICD-10-CM | POA: Diagnosis not present

## 2022-10-04 DIAGNOSIS — Z23 Encounter for immunization: Secondary | ICD-10-CM | POA: Diagnosis not present

## 2022-10-04 DIAGNOSIS — I10 Essential (primary) hypertension: Secondary | ICD-10-CM | POA: Diagnosis not present

## 2022-10-04 DIAGNOSIS — E1122 Type 2 diabetes mellitus with diabetic chronic kidney disease: Secondary | ICD-10-CM | POA: Diagnosis not present

## 2022-12-06 DIAGNOSIS — H34812 Central retinal vein occlusion, left eye, with macular edema: Secondary | ICD-10-CM | POA: Diagnosis not present

## 2023-02-28 DIAGNOSIS — H34812 Central retinal vein occlusion, left eye, with macular edema: Secondary | ICD-10-CM | POA: Diagnosis not present

## 2023-04-18 DIAGNOSIS — Z1231 Encounter for screening mammogram for malignant neoplasm of breast: Secondary | ICD-10-CM | POA: Diagnosis not present

## 2023-04-21 DIAGNOSIS — G4733 Obstructive sleep apnea (adult) (pediatric): Secondary | ICD-10-CM | POA: Diagnosis not present

## 2023-04-22 DIAGNOSIS — Z0000 Encounter for general adult medical examination without abnormal findings: Secondary | ICD-10-CM | POA: Diagnosis not present

## 2023-04-22 DIAGNOSIS — R600 Localized edema: Secondary | ICD-10-CM | POA: Diagnosis not present

## 2023-04-22 DIAGNOSIS — E7800 Pure hypercholesterolemia, unspecified: Secondary | ICD-10-CM | POA: Diagnosis not present

## 2023-04-22 DIAGNOSIS — M170 Bilateral primary osteoarthritis of knee: Secondary | ICD-10-CM | POA: Diagnosis not present

## 2023-04-22 DIAGNOSIS — E1140 Type 2 diabetes mellitus with diabetic neuropathy, unspecified: Secondary | ICD-10-CM | POA: Diagnosis not present

## 2023-04-22 DIAGNOSIS — G4733 Obstructive sleep apnea (adult) (pediatric): Secondary | ICD-10-CM | POA: Diagnosis not present

## 2023-04-22 DIAGNOSIS — N182 Chronic kidney disease, stage 2 (mild): Secondary | ICD-10-CM | POA: Diagnosis not present

## 2023-04-22 DIAGNOSIS — H34812 Central retinal vein occlusion, left eye, with macular edema: Secondary | ICD-10-CM | POA: Diagnosis not present

## 2023-04-22 DIAGNOSIS — I10 Essential (primary) hypertension: Secondary | ICD-10-CM | POA: Diagnosis not present

## 2023-04-22 DIAGNOSIS — E1122 Type 2 diabetes mellitus with diabetic chronic kidney disease: Secondary | ICD-10-CM | POA: Diagnosis not present

## 2023-04-22 DIAGNOSIS — Z1159 Encounter for screening for other viral diseases: Secondary | ICD-10-CM | POA: Diagnosis not present

## 2023-05-22 DIAGNOSIS — G4733 Obstructive sleep apnea (adult) (pediatric): Secondary | ICD-10-CM | POA: Diagnosis not present

## 2023-06-06 DIAGNOSIS — H34812 Central retinal vein occlusion, left eye, with macular edema: Secondary | ICD-10-CM | POA: Diagnosis not present

## 2023-06-12 DIAGNOSIS — G4733 Obstructive sleep apnea (adult) (pediatric): Secondary | ICD-10-CM | POA: Diagnosis not present

## 2023-06-30 DIAGNOSIS — M170 Bilateral primary osteoarthritis of knee: Secondary | ICD-10-CM | POA: Diagnosis not present

## 2023-06-30 DIAGNOSIS — G8929 Other chronic pain: Secondary | ICD-10-CM | POA: Diagnosis not present

## 2023-06-30 DIAGNOSIS — M25561 Pain in right knee: Secondary | ICD-10-CM | POA: Diagnosis not present

## 2023-06-30 DIAGNOSIS — M25562 Pain in left knee: Secondary | ICD-10-CM | POA: Diagnosis not present

## 2023-07-21 DIAGNOSIS — G4733 Obstructive sleep apnea (adult) (pediatric): Secondary | ICD-10-CM | POA: Diagnosis not present

## 2023-08-20 DIAGNOSIS — G4733 Obstructive sleep apnea (adult) (pediatric): Secondary | ICD-10-CM | POA: Diagnosis not present

## 2023-09-03 DIAGNOSIS — H34812 Central retinal vein occlusion, left eye, with macular edema: Secondary | ICD-10-CM | POA: Diagnosis not present

## 2023-09-20 DIAGNOSIS — G4733 Obstructive sleep apnea (adult) (pediatric): Secondary | ICD-10-CM | POA: Diagnosis not present

## 2023-09-25 ENCOUNTER — Other Ambulatory Visit: Payer: Self-pay | Admitting: Podiatry

## 2023-10-20 DIAGNOSIS — G4733 Obstructive sleep apnea (adult) (pediatric): Secondary | ICD-10-CM | POA: Diagnosis not present

## 2023-10-22 DIAGNOSIS — E1140 Type 2 diabetes mellitus with diabetic neuropathy, unspecified: Secondary | ICD-10-CM | POA: Diagnosis not present

## 2023-10-22 DIAGNOSIS — N181 Chronic kidney disease, stage 1: Secondary | ICD-10-CM | POA: Diagnosis not present

## 2023-10-22 DIAGNOSIS — H34812 Central retinal vein occlusion, left eye, with macular edema: Secondary | ICD-10-CM | POA: Diagnosis not present

## 2023-10-22 DIAGNOSIS — I10 Essential (primary) hypertension: Secondary | ICD-10-CM | POA: Diagnosis not present

## 2023-10-22 DIAGNOSIS — R600 Localized edema: Secondary | ICD-10-CM | POA: Diagnosis not present

## 2023-10-22 DIAGNOSIS — E7800 Pure hypercholesterolemia, unspecified: Secondary | ICD-10-CM | POA: Diagnosis not present

## 2023-10-22 DIAGNOSIS — M170 Bilateral primary osteoarthritis of knee: Secondary | ICD-10-CM | POA: Diagnosis not present

## 2023-10-22 DIAGNOSIS — E1122 Type 2 diabetes mellitus with diabetic chronic kidney disease: Secondary | ICD-10-CM | POA: Diagnosis not present

## 2023-11-19 DIAGNOSIS — G4733 Obstructive sleep apnea (adult) (pediatric): Secondary | ICD-10-CM | POA: Diagnosis not present
